# Patient Record
Sex: Female | Born: 1980 | Race: White | Hispanic: No | Smoking: Former smoker
Health system: Southern US, Community
[De-identification: ages and names within clinical notes are randomized; demographics above are authoritative.]

## PROBLEM LIST (undated history)

## (undated) DIAGNOSIS — O24419 Gestational diabetes mellitus in pregnancy, unspecified control: Secondary | ICD-10-CM

## (undated) DIAGNOSIS — N200 Calculus of kidney: Secondary | ICD-10-CM

## (undated) HISTORY — PX: OTHER SURGICAL HISTORY: SHX169

## (undated) HISTORY — PX: TUBAL LIGATION: SHX77

---

## 2014-11-19 ENCOUNTER — Emergency Department (HOSPITAL_COMMUNITY)
Admission: EM | Admit: 2014-11-19 | Discharge: 2014-11-19 | Disposition: A | Discharge status: Home / Self Care | Payer: Self-pay | Attending: Emergency Medicine | Admitting: Emergency Medicine

## 2014-11-19 ENCOUNTER — Encounter (HOSPITAL_COMMUNITY): Payer: Self-pay | Admitting: Emergency Medicine

## 2014-11-19 DIAGNOSIS — Z3202 Encounter for pregnancy test, result negative: Secondary | ICD-10-CM | POA: Insufficient documentation

## 2014-11-19 DIAGNOSIS — Z8632 Personal history of gestational diabetes: Secondary | ICD-10-CM | POA: Insufficient documentation

## 2014-11-19 DIAGNOSIS — R42 Dizziness and giddiness: Secondary | ICD-10-CM | POA: Insufficient documentation

## 2014-11-19 DIAGNOSIS — A084 Viral intestinal infection, unspecified: Secondary | ICD-10-CM | POA: Insufficient documentation

## 2014-11-19 DIAGNOSIS — R51 Headache: Secondary | ICD-10-CM | POA: Insufficient documentation

## 2014-11-19 HISTORY — DX: Gestational diabetes mellitus in pregnancy, unspecified control: O24.419

## 2014-11-19 LAB — COMPREHENSIVE METABOLIC PANEL
ALBUMIN: 3.7 g/dL (ref 3.5–5.0)
ALK PHOS: 79 U/L (ref 38–126)
ALT: 17 U/L (ref 14–54)
ANION GAP: 8 (ref 5–15)
AST: 21 U/L (ref 15–41)
BUN: 10 mg/dL (ref 6–20)
CALCIUM: 8.7 mg/dL — AB (ref 8.9–10.3)
CO2: 22 mmol/L (ref 22–32)
CREATININE: 0.71 mg/dL (ref 0.44–1.00)
Chloride: 107 mmol/L (ref 101–111)
GFR calc Af Amer: >60 mL/min (ref >60)
GFR calc non Af Amer: >60 mL/min (ref >60)
GLUCOSE: 120 mg/dL — AB (ref 65–99)
POTASSIUM: 3.8 mmol/L (ref 3.5–5.1)
SODIUM: 137 mmol/L (ref 135–145)
TOTAL PROTEIN: 6.3 g/dL — AB (ref 6.5–8.1)
Total Bilirubin: 0.5 mg/dL (ref 0.3–1.2)

## 2014-11-19 LAB — CBC
HCT: 40.1 % (ref 36.0–46.0)
Hemoglobin: 13.6 g/dL (ref 12.0–15.0)
MCH: 30.6 pg (ref 26.0–34.0)
MCHC: 33.9 g/dL (ref 30.0–36.0)
MCV: 90.3 fL (ref 78.0–100.0)
PLATELETS: 287 10*3/uL (ref 150–400)
RBC: 4.44 MIL/uL (ref 3.87–5.11)
RDW: 12.6 % (ref 11.5–15.5)
WBC: 16.8 10*3/uL — ABNORMAL HIGH (ref 4.0–10.5)

## 2014-11-19 LAB — I-STAT BETA HCG BLOOD, ED (MC, WL, AP ONLY)

## 2014-11-19 LAB — URINE MICROSCOPIC-ADD ON

## 2014-11-19 LAB — URINALYSIS, ROUTINE W REFLEX MICROSCOPIC
BILIRUBIN URINE: NEGATIVE
GLUCOSE, UA: NEGATIVE mg/dL
HGB URINE DIPSTICK: NEGATIVE
Ketones, ur: NEGATIVE mg/dL
Leukocytes, UA: NEGATIVE
Nitrite: NEGATIVE
PROTEIN: 100 mg/dL — AB
Specific Gravity, Urine: 1.023 (ref 1.005–1.030)
UROBILINOGEN UA: 0.2 mg/dL (ref 0.0–1.0)
pH: 8.5 — ABNORMAL HIGH (ref 5.0–8.0)

## 2014-11-19 LAB — LIPASE, BLOOD: Lipase: 23 U/L (ref 11–51)

## 2014-11-19 MED ORDER — ONDANSETRON 4 MG PO TBDP: 4.0000 mg | ORAL_TABLET | Freq: Three times a day (TID) | ORAL | Status: DC | PRN

## 2014-11-19 MED ORDER — SODIUM CHLORIDE 0.9 % IV BOLUS (SEPSIS)
1000.0000 mL | Freq: Once | INTRAVENOUS | Status: AC
  Administered 2014-11-19: 1000 mL via INTRAVENOUS

## 2014-11-19 MED ORDER — ONDANSETRON HCL 4 MG/2ML IJ SOLN
4.0000 mg | Freq: Once | INTRAMUSCULAR | Status: AC
  Administered 2014-11-19: 4 mg via INTRAVENOUS
  Filled 2014-11-19: qty 2

## 2014-11-19 MED ORDER — PROMETHAZINE HCL 25 MG/ML IJ SOLN
25.0000 mg | Freq: Once | INTRAMUSCULAR | Status: AC
  Administered 2014-11-19: 25 mg via INTRAVENOUS
  Filled 2014-11-19: qty 1

## 2014-11-19 MED ORDER — IBUPROFEN 800 MG PO TABS
800.0000 mg | ORAL_TABLET | Freq: Once | ORAL | Status: AC
  Administered 2014-11-19: 800 mg via ORAL
  Filled 2014-11-19: qty 1

## 2014-11-19 NOTE — ED Notes (Signed)
Pt c/o headache x 1 week. Pt began vomiting and having diarrhea last night.

## 2014-11-19 NOTE — Discharge Instructions (Signed)
Take your medications as prescribed as needed for nausea and vomiting. You may also take Ibuprofen as prescribed over the counter for your headache.  Please follow up with a primary care provider from the Resource Guide provided below in 4-5 days. Please return to the Emergency Department if symptoms worsen or new onset of fever, vomiting blood, blood in stool, abdominal pain, vaginal bleeding, neck stiffness, visual changes, sensitivity to light, numbness, tingling, weakness, seizures, syncope.    Emergency Department Resource Guide 1) Find a Doctor and Pay Out of Pocket Although you won't have to find out who is covered by your insurance plan, it is a good idea to ask around and get recommendations. You will then need to call the office and see if the doctor you have chosen will accept you as a new patient and what types of options they offer for patients who are self-pay. Some doctors offer discounts or will set up payment plans for their patients who do not have insurance, but you will need to ask so you aren't surprised when you get to your appointment.  2) Contact Your Local Health Department Not all health departments have doctors that can see patients for sick visits, but many do, so it is worth a call to see if yours does. If you don't know where your local health department is, you can check in your phone book. The CDC also has a tool to help you locate your state's health department, and many state websites also have listings of all of their local health departments.  3) Find a Walk-in Clinic If your illness is not likely to be very severe or complicated, you may want to try a walk in clinic. These are popping up all over the country in pharmacies, drugstores, and shopping centers. They're usually staffed by nurse practitioners or physician assistants that have been trained to treat common illnesses and complaints. They're usually fairly quick and inexpensive. However, if you have serious  medical issues or chronic medical problems, these are probably not your best option.  No Primary Care Doctor: - Call Health Connect at  [phone] - they can help you locate a primary care doctor that  accepts your insurance, provides certain services, etc. - Physician Referral Service- [phone]  Chronic Pain Problems: Organization         Address  Phone   Notes  [facility]  [phone] Patients need to be referred by their primary care doctor.   Medication Assistance: Organization         Address  Phone   Notes  [facility] Medication [facility] [address] [place] [place], [place] [address] [phone] --Must be a resident of [facility] -- Must have NO insurance coverage whatsoever (no Medicaid/ Medicare, etc.) -- The pt. MUST have a primary care doctor that directs their care regularly and follows them in the community   [facility]ist  [phone]   [employer]  [phone]    Agencies that provide inexpensive medical care: Organization         Address  Phone   Notes  [facility]  [phone]   [facility]    [phone]   [facility] [address] [place], [place] [address] [phone]   [facility] [address] [place], [place] [phone]   Planned Parenthood    [phone]   [facility]    [phone]   [facility]  [address], [place] Phone:  [phone], Fax:  [phone] Hours of Operation:  9 am - 6 pm, M-F.  Also accepts Medicaid/Medicare and self-pay.  [facility]  [address], [place] Phone: [phone], Fax: [phone]. Hours of Operation:  8:30 am - 5:30 pm, M-F.  Also accepts Medicaid and self-pay.  [facility] [place] [address], [place] Phone: [phone]   [facility] [address] Natasha Bence  [place], [place] [phone], Ext. [phone] Mondays & Thursdays: 7-9 AM.  First 15 patients are seen on a first come, first serve basis.    Medicaid-accepting [facility] Providers:  Organization         Address  Phone   Notes  [facility] [address], [place] [phone] Also accepts self-pay patients.  [facility] [address] Laurell Josephs [place], [place]  [phone]   [facility] [address], [place] [phone]   [facility] [address] [place] [phone]   Renaye Rakers [address], [place]   [phone] Only accepts [place] patients after they have their name applied to their card.   Self-Pay (no insurance) in [facility]:  Organization         Address  Phone   Notes  Sickle Cell Patients, [facility] [address] [place], [place] [phone]   [facility] Urgent Care [address] [place], [place] [phone]   [facility]  [address], [place] [phone]   [facility]/Dr. Osei-Bonsu  [address], [place] or [address], [place] [phone] Phone number for both [place] and [place] locations is the same.  Urgent Medical and [facility] [address] [place] [phone]   [facility] [address], [place] or [address] [phone]   [facility] [address] [place] [phone], phone; [phone], fax Sees patients 1st and 3rd Saturday of every month.  Must not qualify for public or private insurance (i.e. Medicaid, Medicare, [place] Health Choice, Veterans' Benefits)  Household income should be no more than 200% of the poverty level The clinic cannot treat you if you are pregnant or think you are pregnant  Sexually  transmitted diseases are not treated at the clinic.    Dental Care: Organization         Address  Phone  Notes  [facility] Department of [facility] [address] [place], [place] [phone] Accepts children up to age 72 who are enrolled in [place] or [place] Health Choice; pregnant women with a Medicaid card; and children who have applied for Medicaid or [place] Health Choice, but were declined, whose parents can pay a reduced fee at time of service.  [facility]  [address], [place] [phone] Accepts children up to age 47 who are enrolled in [place] or [place] Health Choice; pregnant women with a Medicaid card; and children who have applied for Medicaid or [place] Health Choice, but were declined, whose parents can pay a reduced fee at time of service.  [facility]  [address] [place], [place] [phone] Patients are seen by appointment only. Walk-ins are not accepted. [facility] will see patients 20 years of age and older. Monday - Tuesday (8am-5pm) Most Wednesdays (8:30-5pm) $30 per visit, cash only  [facility] Access PRO[facility]  [address], [facility] [phone] Patients are seen by appointment only. Walk-ins are not accepted. [facility] will see patients 28 years of age and older. One Wednesday Evening (Monthly: Volunteer Based).  $30 per visit, cash only  [employer]  [phone] for adults; Children under age 35, call Graduate Pediatric Dentistry at [phone]. Children aged 33-14, please call [phone] to request a pediatric application.  Dental services are provided in all areas of dental care including fillings, crowns and bridges, complete and partial dentures, implants, gum treatment, root canals, and extractions. Preventive care is also provided. Treatment is provided to both adults and children. Patients are  selected via a lottery and there is often a waiting list.   [facility] [address] [place]  [phone] [email or web address]   [facility] [address] [place], [place] [phone], Ext. [phone] Second and Fourth Thursday of each month, opens at 6:30 AM; Clinic ends at 9 AM.  Patients are seen on a first-come first-served basis, and a limited number are seen during each clinic.   [facility]  [address] Ether Griffins [place], [place] [phone]   Eligibility Requirements You must have lived in [place], [place], or [place] counties for at least the last three months.   You cannot be eligible for state or federal sponsored [place], including [employer], [place], or [employer].   You generally cannot be eligible for healthcare insurance through your employer.    How to apply: Eligibility screenings are held every Tuesday and Wednesday afternoon from 1:00 pm until 4:00 pm. You do not need an appointment for the interview!  [facility] [address], [place], [place] [phone]   [facility] Health Department  [phone]   [facility] Health Department  [phone]   [facility] Health Department  [phone]    Behavioral Health Resources in the Community: Intensive Outpatient Programs Organization         Address  Phone  Notes  [facility] [address] [place], [place] [phone]   [facility] [address], [place], [place] [phone]   [facility] [address] [place], [place]  [phone]   [facility] Mental Health [address]  [place], [place] [phone] or [phone]   Substance Abuse Resources Organization         Address  Phone  Notes  [facility]  [phone]   [facility]  [phone]   [facility] [place]  [phone]   Floydene Flock  [phone]     Residential & Outpatient Substance Abuse Program  [phone]   Psychological Services Organization         Address  Phone  Notes  [facility] Behavioral Health  [phone]   [facility] Services  [phone]   [facility] [address], [place] [phone] or [phone]    Mobile Crisis Teams Organization         Address  Phone  Notes  [facility], Mobile Crisis Care Unit  [phone]   [facility]  [address]  [place], [place] [phone]   Doristine LocksSharon DeEsch [address] and [place] [phone]    Self-Help/Support Groups Organization         Address  Phone             Notes  [facility] [place] - variety of support groups  336- [phone] Call for more information  Narcotics Anonymous (NA), [facility]aring [facility] [address], [place]  2 meetings at this location   Statisticianesidential Treatment Programs Organization         Address  Phone  Notes  [facility] [address],    [place]  [phone]   [facility]  [address], [place] [phone], [place], [place] [phone]   [facility] [address], [place] [phone] Admissions: 8am-3pm M-F  [facility] [address]    [place], [place] [phone]   [facility] [address], [place], [place] [phone]   [facility] [address]  [place], [place] [phone]   [facility] - Intensive Outpatient [address], [place], [place] [phone]   [facility] ([facility] [address]  [place], [place] [phone] or [phone]   [facility] ([facility]) [address] [place], [place] [phone] Accepts Medicaid  [facility] [address]  [place] [phone] Substance Abuse/Addiction Treatment   [facility] Behavioral Health  Resources Organization         Address  Phone  Notes  [facility]  [phone]) [phone]   Angie FavaJulie Brannon, PhD [address] [place], [place]   [phone] or [phone]   [facility]   [address] [place], [place] [phone](336) [phone]   [facility] [address], [place], [place] [phone](336) [phone] Insurance/Medicaid/sponsorship through [facility]  [facility] [address]                                    [place], [place] [phone](336) [phone] Therapy/tele-psych/case  [facility] [address]   [place], [place] [phone]) [phone]    Dr. Lolly MustacheArfeen  [phone]) [phone]   [facility]  [employer] [facility] 1) [address], [place] 2) [address], [place] 3)  [address], [place] [phone]) [phone](336) [phone]  [phone](336) [phone]   [facility] Child Abuse Hotline [phone]) [phone] or [phone](336) [phone] (After Hours)

## 2014-11-19 NOTE — ED Notes (Signed)
Drank water without difficulty.

## 2014-11-19 NOTE — ED Provider Notes (Signed)
CSN: [id number]     Arrival date & time 11/19/14  1009 History   First MD Initiated Contact with Patient 11/19/14 1022     Chief Complaint  Patient presents with  . Diarrhea  . Emesis     (Consider location/radiation/quality/duration/timing/severity/associated sxs/prior Treatment) HPI   Patient is a 34 year old female presents to the ED with complaint of headache, onset one week. Patient reports having worsening headache to her posterior scalp that she describes as a constant dull ache, no aggravating or alleviating factors. Denies history of headache/migraine. Pt denies fever, neck stiffness, visual changes, photophobia, abdominal pain, urinary symptoms, numbness, tingling, weakness, seizures, syncope. She notes she has not been taking medication at home for pain. Patient also reports having nausea, vomiting and diarrhea that started last night. Denies blood in emesis or stool. She reports having >10 episodes of vomiting and 4 episodes of diarrhea. Denies any sick contacts.  Past Medical History  Diagnosis Date  . Gestational diabetes    Past Surgical History  Procedure Laterality Date  . Lithrotripsy    . Tubal ligation     No family history on file. Social History  Substance Use Topics  . Smoking status: Never Smoker   . Smokeless tobacco: None  . Alcohol Use: Yes   OB History    No data available     Review of Systems  Gastrointestinal: Positive for nausea, vomiting and diarrhea.  Neurological: Positive for dizziness and headaches.  All other systems reviewed and are negative.     Allergies  Review of patient's allergies indicates no known allergies.  Home Medications   Prior to Admission medications   Medication Sig Start Date End Date Taking? Authorizing Provider  ondansetron (ZOFRAN ODT) 4 MG disintegrating tablet Take 1 tablet (4 mg total) by mouth every 8 (eight) hours as needed for nausea or vomiting. 11/19/14   Satira SarkNicole Elizabeth Vamsi Apfel, PA-C   BP 106/64  mmHg  Pulse 91  Temp(Src) 97.8 F (36.6 C) (Oral)  Resp 22  Ht 5\' 2"  (1.575 m)  Wt 160 lb (72.576 kg)  BMI 29.26 kg/m2  SpO2 100%  LMP 11/07/2014 Physical Exam  Constitutional: She is oriented to person, place, and time. She appears well-developed and well-nourished. No distress.  HENT:  Head: Normocephalic and atraumatic.  Right Ear: Tympanic membrane normal.  Left Ear: Tympanic membrane normal.  Nose: Nose normal. Right sinus exhibits no maxillary sinus tenderness and no frontal sinus tenderness. Left sinus exhibits no maxillary sinus tenderness and no frontal sinus tenderness.  Mouth/Throat: Uvula is midline, oropharynx is clear and moist and mucous membranes are normal. No oropharyngeal exudate, posterior oropharyngeal edema or posterior oropharyngeal erythema.  Eyes: Conjunctivae and EOM are normal. Pupils are equal, round, and reactive to light. Right eye exhibits no discharge. Left eye exhibits no discharge. No scleral icterus.  Neck: Normal range of motion. Neck supple.  Cardiovascular: Normal rate, regular rhythm, normal heart sounds and intact distal pulses.   Pulmonary/Chest: Effort normal and breath sounds normal. No respiratory distress. She has no wheezes. She has no rales. She exhibits no tenderness.  Abdominal: Soft. Bowel sounds are normal. She exhibits no distension and no mass. There is no tenderness. There is no rebound and no guarding.  Musculoskeletal: Normal range of motion. She exhibits no edema.  Lymphadenopathy:    She has no cervical adenopathy.  Neurological: She is alert and oriented to person, place, and time. She has normal strength. No sensory deficit. Coordination and gait normal.  Skin:  Skin is warm and dry.  Nursing note and vitals reviewed.   ED Course  Procedures (including critical care time) Labs Review Labs Reviewed  COMPREHENSIVE METABOLIC PANEL - Abnormal; Notable for the following:    Glucose, Bld 120 (*)    Calcium 8.7 (*)    Total  Protein 6.3 (*)    All other components within normal limits  CBC - Abnormal; Notable for the following:    WBC 16.8 (*)    All other components within normal limits  URINALYSIS, ROUTINE W REFLEX MICROSCOPIC (NOT AT [facility]) - Abnormal; Notable for the following:    APPearance CLOUDY (*)    pH 8.5 (*)    Protein, ur 100 (*)    All other components within normal limits  URINE MICROSCOPIC-ADD ON - Abnormal; Notable for the following:    Squamous Epithelial / LPF FEW (*)    Bacteria, UA FEW (*)    All other components within normal limits  LIPASE, BLOOD  I-STAT BETA HCG BLOOD, ED ([facility], [facility], [facility] ONLY)    Imaging Review No results found. I have personally reviewed and evaluated these images and lab results as part of my medical decision-making.   MDM   Final diagnoses:  Viral gastroenteritis    Pt presents with N/V/D and headache. She reports having mild HA earlier in the week that worsened with onset of vomiting. Denies fever or abdominal pain. VSS. Exam unremarkable, no neuro deficits. Pt given IVF, antiemetics and pain meds. WBC 16.8. UA unremarkable. Pt reports nausea and headache have improved. Pt able to tolerate PO. I suspect sxs are likley due to viral gastroenteritis. I do not suspect intracranial lesion, meningitis, encephalopathy or encephalitis and do not think that cranial imaging is needed at this time.  Plan to d/c pt home with symptomatic tx. Pt advised to follow up with PCP.  Evaluation does not show pathology requring ongoing emergent intervention or admission. Pt is hemodynamically stable and mentating appropriately. Discussed findings/results and plan with patient/guardian, who agrees with plan. All questions answered. Return precautions discussed and outpatient follow up given.      Satira Sark [place], [place] 11/19/14 1409  Laurence Spates, MD 11/19/14 [phone]

## 2019-05-04 DIAGNOSIS — R1013 Epigastric pain: Secondary | ICD-10-CM | POA: Diagnosis not present

## 2019-05-04 DIAGNOSIS — M549 Dorsalgia, unspecified: Secondary | ICD-10-CM | POA: Diagnosis not present

## 2019-05-04 DIAGNOSIS — Z87891 Personal history of nicotine dependence: Secondary | ICD-10-CM | POA: Diagnosis not present

## 2019-05-04 DIAGNOSIS — N3000 Acute cystitis without hematuria: Secondary | ICD-10-CM | POA: Diagnosis not present

## 2019-05-04 DIAGNOSIS — R109 Unspecified abdominal pain: Secondary | ICD-10-CM | POA: Diagnosis not present

## 2019-08-13 DIAGNOSIS — Z6828 Body mass index (BMI) 28.0-28.9, adult: Secondary | ICD-10-CM | POA: Diagnosis not present

## 2019-08-13 DIAGNOSIS — M79641 Pain in right hand: Secondary | ICD-10-CM | POA: Diagnosis not present

## 2019-08-13 DIAGNOSIS — Z87442 Personal history of urinary calculi: Secondary | ICD-10-CM | POA: Diagnosis not present

## 2019-08-13 DIAGNOSIS — Z87891 Personal history of nicotine dependence: Secondary | ICD-10-CM | POA: Diagnosis not present

## 2019-08-13 DIAGNOSIS — G5601 Carpal tunnel syndrome, right upper limb: Secondary | ICD-10-CM | POA: Diagnosis not present

## 2019-08-13 DIAGNOSIS — M7989 Other specified soft tissue disorders: Secondary | ICD-10-CM | POA: Diagnosis not present

## 2019-08-13 DIAGNOSIS — M79644 Pain in right finger(s): Secondary | ICD-10-CM | POA: Diagnosis not present

## 2019-09-21 DIAGNOSIS — J029 Acute pharyngitis, unspecified: Secondary | ICD-10-CM | POA: Diagnosis not present

## 2019-09-21 DIAGNOSIS — U071 COVID-19: Secondary | ICD-10-CM | POA: Diagnosis not present

## 2019-09-21 DIAGNOSIS — Z6828 Body mass index (BMI) 28.0-28.9, adult: Secondary | ICD-10-CM | POA: Diagnosis not present

## 2019-09-21 DIAGNOSIS — R0602 Shortness of breath: Secondary | ICD-10-CM | POA: Diagnosis not present

## 2019-09-21 DIAGNOSIS — R509 Fever, unspecified: Secondary | ICD-10-CM | POA: Diagnosis not present

## 2019-09-21 DIAGNOSIS — Z87891 Personal history of nicotine dependence: Secondary | ICD-10-CM | POA: Diagnosis not present

## 2019-09-21 DIAGNOSIS — H9203 Otalgia, bilateral: Secondary | ICD-10-CM | POA: Diagnosis not present

## 2020-07-23 DIAGNOSIS — Z20822 Contact with and (suspected) exposure to covid-19: Secondary | ICD-10-CM | POA: Diagnosis not present

## 2020-07-23 DIAGNOSIS — N926 Irregular menstruation, unspecified: Secondary | ICD-10-CM | POA: Diagnosis not present

## 2020-07-23 DIAGNOSIS — R109 Unspecified abdominal pain: Secondary | ICD-10-CM | POA: Diagnosis not present

## 2020-07-23 DIAGNOSIS — R5383 Other fatigue: Secondary | ICD-10-CM | POA: Diagnosis not present

## 2020-07-23 DIAGNOSIS — M791 Myalgia, unspecified site: Secondary | ICD-10-CM | POA: Diagnosis not present

## 2020-07-23 DIAGNOSIS — R1013 Epigastric pain: Secondary | ICD-10-CM | POA: Diagnosis not present

## 2020-07-23 DIAGNOSIS — Z87891 Personal history of nicotine dependence: Secondary | ICD-10-CM | POA: Diagnosis not present

## 2020-07-23 DIAGNOSIS — R112 Nausea with vomiting, unspecified: Secondary | ICD-10-CM | POA: Diagnosis not present

## 2020-07-23 DIAGNOSIS — K760 Fatty (change of) liver, not elsewhere classified: Secondary | ICD-10-CM | POA: Diagnosis not present

## 2020-07-23 DIAGNOSIS — D72829 Elevated white blood cell count, unspecified: Secondary | ICD-10-CM | POA: Diagnosis not present

## 2020-07-24 ENCOUNTER — Emergency Department (HOSPITAL_BASED_OUTPATIENT_CLINIC_OR_DEPARTMENT_OTHER)
Admission: EM | Admit: 2020-07-24 | Discharge: 2020-07-25 | Disposition: A | Discharge status: Home / Self Care | Payer: BC Managed Care – PPO | Attending: Emergency Medicine | Admitting: Emergency Medicine

## 2020-07-24 ENCOUNTER — Other Ambulatory Visit: Payer: Self-pay

## 2020-07-24 ENCOUNTER — Emergency Department (HOSPITAL_BASED_OUTPATIENT_CLINIC_OR_DEPARTMENT_OTHER): Payer: BC Managed Care – PPO

## 2020-07-24 ENCOUNTER — Encounter (HOSPITAL_BASED_OUTPATIENT_CLINIC_OR_DEPARTMENT_OTHER): Payer: Self-pay

## 2020-07-24 DIAGNOSIS — R5381 Other malaise: Secondary | ICD-10-CM | POA: Diagnosis not present

## 2020-07-24 DIAGNOSIS — D72829 Elevated white blood cell count, unspecified: Secondary | ICD-10-CM | POA: Diagnosis not present

## 2020-07-24 DIAGNOSIS — G8929 Other chronic pain: Secondary | ICD-10-CM | POA: Diagnosis not present

## 2020-07-24 DIAGNOSIS — F17210 Nicotine dependence, cigarettes, uncomplicated: Secondary | ICD-10-CM | POA: Insufficient documentation

## 2020-07-24 DIAGNOSIS — R110 Nausea: Secondary | ICD-10-CM | POA: Diagnosis not present

## 2020-07-24 DIAGNOSIS — M79641 Pain in right hand: Secondary | ICD-10-CM

## 2020-07-24 DIAGNOSIS — R5383 Other fatigue: Secondary | ICD-10-CM | POA: Insufficient documentation

## 2020-07-24 DIAGNOSIS — M545 Low back pain, unspecified: Secondary | ICD-10-CM

## 2020-07-24 DIAGNOSIS — R112 Nausea with vomiting, unspecified: Secondary | ICD-10-CM | POA: Diagnosis not present

## 2020-07-24 HISTORY — DX: Calculus of kidney: N20.0

## 2020-07-24 NOTE — ED Triage Notes (Signed)
Pt c/o right hand pain x today-denies injury-chart reads dx carpal tunnel 08/2019-states she has had no pain until today-pt also c/o n/v x 2 days-[facility] ED yesterday for same-states she has nausea but no vomiting-NAD-steady gait

## 2020-07-25 LAB — COMPREHENSIVE METABOLIC PANEL
ALT: 13 U/L (ref 0–44)
AST: 15 U/L (ref 15–41)
Albumin: 3.6 g/dL (ref 3.5–5.0)
Alkaline Phosphatase: 65 U/L (ref 38–126)
Anion gap: 7 (ref 5–15)
BUN: 18 mg/dL (ref 6–20)
CO2: 27 mmol/L (ref 22–32)
Calcium: 8.7 mg/dL — ABNORMAL LOW (ref 8.9–10.3)
Chloride: 103 mmol/L (ref 98–111)
Creatinine, Ser: 1.01 mg/dL — ABNORMAL HIGH (ref 0.44–1.00)
GFR, Estimated: >60 mL/min (ref >60)
Glucose, Bld: 92 mg/dL (ref 70–99)
Potassium: 3.7 mmol/L (ref 3.5–5.1)
Sodium: 137 mmol/L (ref 135–145)
Total Bilirubin: 0.3 mg/dL (ref 0.3–1.2)
Total Protein: 6.3 g/dL — ABNORMAL LOW (ref 6.5–8.1)

## 2020-07-25 LAB — CBC WITH DIFFERENTIAL/PLATELET
Abs Immature Granulocytes: 0.08 10*3/uL — ABNORMAL HIGH (ref 0.00–0.07)
Basophils Absolute: 0.1 10*3/uL (ref 0.0–0.1)
Basophils Relative: 1 %
Eosinophils Absolute: 0.4 10*3/uL (ref 0.0–0.5)
Eosinophils Relative: 3 %
HCT: 37.6 % (ref 36.0–46.0)
Hemoglobin: 12.7 g/dL (ref 12.0–15.0)
Immature Granulocytes: 1 %
Lymphocytes Relative: 22 %
Lymphs Abs: 3.2 10*3/uL (ref 0.7–4.0)
MCH: 30.8 pg (ref 26.0–34.0)
MCHC: 33.8 g/dL (ref 30.0–36.0)
MCV: 91 fL (ref 80.0–100.0)
Monocytes Absolute: 0.7 10*3/uL (ref 0.1–1.0)
Monocytes Relative: 5 %
Neutro Abs: 10.3 10*3/uL — ABNORMAL HIGH (ref 1.7–7.7)
Neutrophils Relative %: 68 %
Platelets: 322 10*3/uL (ref 150–400)
RBC: 4.13 MIL/uL (ref 3.87–5.11)
RDW: 12.7 % (ref 11.5–15.5)
WBC: 14.8 10*3/uL — ABNORMAL HIGH (ref 4.0–10.5)
nRBC: 0 % (ref 0.0–0.2)

## 2020-07-25 LAB — URINALYSIS, ROUTINE W REFLEX MICROSCOPIC
Bilirubin Urine: NEGATIVE
Glucose, UA: NEGATIVE mg/dL
Hgb urine dipstick: NEGATIVE
Ketones, ur: NEGATIVE mg/dL
Leukocytes,Ua: NEGATIVE
Nitrite: NEGATIVE
Protein, ur: NEGATIVE mg/dL
Specific Gravity, Urine: 1.025 (ref 1.005–1.030)
pH: 6 (ref 5.0–8.0)

## 2020-07-25 LAB — CK: Total CK: 41 U/L (ref 38–234)

## 2020-07-25 LAB — SEDIMENTATION RATE: Sed Rate: 13 mm/hr (ref 0–22)

## 2020-07-25 LAB — C-REACTIVE PROTEIN: CRP: 0.6 mg/dL (ref <1.0)

## 2020-07-25 MED ORDER — PROCHLORPERAZINE MALEATE 10 MG PO TABS: 10.0000 mg | ORAL_TABLET | Freq: Two times a day (BID) | ORAL | 0 refills | Status: DC | PRN

## 2020-07-25 MED ORDER — KETOROLAC TROMETHAMINE 15 MG/ML IJ SOLN
15.0000 mg | Freq: Once | INTRAMUSCULAR | Status: AC
  Administered 2020-07-25: 15 mg via INTRAVENOUS
  Filled 2020-07-25: qty 1

## 2020-07-25 MED ORDER — PROCHLORPERAZINE EDISYLATE 10 MG/2ML IJ SOLN
10.0000 mg | Freq: Once | INTRAMUSCULAR | Status: AC
  Administered 2020-07-25: 10 mg via INTRAVENOUS
  Filled 2020-07-25: qty 2

## 2020-07-25 MED ORDER — SODIUM CHLORIDE 0.9 % IV BOLUS (SEPSIS)
1000.0000 mL | Freq: Once | INTRAVENOUS | Status: AC
  Administered 2020-07-25: 1000 mL via INTRAVENOUS

## 2020-07-25 MED ORDER — SODIUM CHLORIDE 0.9 % IV SOLN
1000.0000 mL | INTRAVENOUS | Status: DC
  Administered 2020-07-25: 1000 mL via INTRAVENOUS

## 2020-07-25 NOTE — ED Provider Notes (Signed)
[facility] EMERGENCY DEPARTMENT Provider Note  CSN: [id number] Arrival date & time: 07/24/20 2205  Chief Complaint(s) Hand Pain  HPI Hannah Stevenson is a 40 y.o. female presenting with several months of fatigue, diffuse myalgias, feeling poorly,  and several days of nausea. She was seen at [facility] for this 2 days ago and had relatively reassuring work up other than leukocytosis.  Her work-up included a rapid COVID/influenza, thyroid panel, metabolic panel, monoscreen, HIV, and urinalysis.  He also obtain a CT scan of the abdomen and pelvis as well as a right upper quadrant ultrasound.  Patient was sent home with Zofran for her nausea.  She has not had any additional episodes of emesis.  Today she presents for right arm pain from down to the hand.  She denies any falls or trauma.  Pain is worse with motion of the wrist.  No swelling.  No alleviating factors.  Patient reports that she With lower back pain for several months. 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 She denies any bladder/bowel incontinence.  No lower extremity weakness or loss of sensation.  She reports that she has been taking 884m TID of Motrin for the past 1-2 weeks for her back pain.   Hand Pain   Past Medical History Past Medical History:  Diagnosis Date   Gestational diabetes    Kidney stone    There are no problems to display for this patient.  Home Medication(s) Prior to Admission medications   Medication Sig Start Date End Date Taking? Authorizing Provider  ondansetron (ZOFRAN ODT) 4 MG disintegrating tablet Take 1 tablet (4 mg total) by mouth every 8 (eight) hours as needed for nausea or vomiting. 11/19/14   NNona Dell PA-C  prochlorperazine (COMPAZINE) 10 MG tablet Take 1 tablet (10 mg total) by mouth 2 (two) times daily as needed for nausea or vomiting. 07/25/20   Dannya Pitkin, PGrayce Sessions MD                                                                                                                                     Past Surgical History Past Surgical History:  Procedure Laterality Date   lithrotripsy     TUBAL LIGATION     Family History No family history on file.  Social History Social History   Tobacco Use   Smoking status: Some Days    Types: Cigarettes   Smokeless tobacco: Never  Vaping Use   Vaping Use: Never used  Substance Use Topics   Alcohol use: Not Currently   Drug use: No   Allergies Patient has no known allergies.  Review of Systems Review of Systems All other systems are reviewed and are negative for acute change except as noted in the HPI  Physical Exam Vital Signs  I have reviewed the triage vital signs  BP 109/73 (BP Location: Left Arm)   Pulse 76   Temp 98.4 F (36.9 C) (Oral)   Resp 18   Wt 73.5 kg   SpO2 95%   BMI 29.63 kg/m   Physical Exam Vitals reviewed.  Constitutional:      General: She is not in acute distress.    Appearance: She is well-developed. She is not diaphoretic.  HENT:     Head: Normocephalic and atraumatic.     Nose: Nose normal.  Eyes:     General: No scleral icterus.       Right eye: No discharge.        Left eye: No discharge.     Conjunctiva/sclera: Conjunctivae normal.     Pupils: Pupils are equal, round, and reactive to light.  Cardiovascular:     Rate and Rhythm: Normal rate and regular rhythm.     Heart sounds: No murmur heard.   No friction rub. No gallop.  Pulmonary:     Effort: Pulmonary effort is normal. No respiratory distress.     Breath sounds: Normal breath sounds. No stridor. No rales.  Abdominal:     General: There is no distension.     Palpations: Abdomen is soft.     Tenderness: There is no abdominal tenderness.  Musculoskeletal:     Right forearm: Tenderness (to brachioradialis muscle which exacerbates her hand pain) present.     Right hand: Decreased range of motion. Decreased strength.  Normal sensation. Normal pulse.     Left hand: Normal strength. Normal sensation. Normal pulse.     Cervical back: Normal range of motion and neck supple.     Thoracic back: Spasms and tenderness present.     Lumbar back: Spasms and tenderness present.       Back:  Skin:    General: Skin is warm and dry.     Findings: No erythema or rash.  Neurological:     Mental Status: She is alert and oriented to person, place, and time.     Comments: Spine Exam: Strength: 5/5 throughout LE bilaterally  Sensation: Intact to light touch in proximal and distal LE bilaterally      ED Results and Treatments Labs (all labs ordered are listed, but only abnormal results are displayed) Labs Reviewed  CBC WITH DIFFERENTIAL/PLATELET - Abnormal; Notable for the following components:      Result Value   WBC 14.8 (*)    Neutro Abs 10.3 (*)    Abs Immature Granulocytes 0.08 (*)    All other components within normal limits  COMPREHENSIVE METABOLIC PANEL - Abnormal; Notable for the following components:   Creatinine, Ser 1.01 (*)    Calcium 8.7 (*)    Total Protein 6.3 (*)    All other components within normal limits  URINALYSIS, ROUTINE W REFLEX MICROSCOPIC  CK  SEDIMENTATION RATE  C-REACTIVE PROTEIN  EKG  EKG Interpretation  Date/Time:    Ventricular Rate:    PR Interval:    QRS Duration:   QT Interval:    QTC Calculation:   R Axis:     Text Interpretation:         Radiology DG Hand Complete Right  Result Date: 07/24/2020 CLINICAL DATA:  Hand pain EXAM: RIGHT HAND - COMPLETE 3+ VIEW COMPARISON:  08/13/2019 FINDINGS: There is no evidence of fracture or dislocation. There is no evidence of arthropathy or other focal bone abnormality. Soft tissues are unremarkable. IMPRESSION: Negative. Electronically Signed   By: Donavan Foil M.D.   On: 07/24/2020 22:41    Pertinent  labs & imaging results that were available during my care of the patient were reviewed by me and considered in my medical decision making (see chart for details).  Medications Ordered in ED Medications  sodium chloride 0.9 % bolus 1,000 mL (0 mLs Intravenous Stopped 07/25/20 0200)    Followed by  0.9 %  sodium chloride infusion (0 mLs Intravenous Stopped 07/25/20 0250)  prochlorperazine (COMPAZINE) injection 10 mg (10 mg Intravenous Given 07/25/20 0048)  ketorolac (TORADOL) 15 MG/ML injection 15 mg (15 mg Intravenous Given 07/25/20 0203)                                                                                                                                    Procedures Procedures  (including critical care time)  Medical Decision Making / ED Course I have reviewed the nursing notes for this encounter and the patient's prior records (if available in EHR or on provided paperwork).   Hannah Stevenson was evaluated in Emergency Department on 07/25/2020 for the symptoms described in the history of present illness. She was evaluated in the context of the global COVID-19 pandemic, which necessitated consideration that the patient might be at risk for infection with the SARS-CoV-2 virus that causes COVID-19. Institutional protocols and algorithms that pertain to the evaluation of patients at risk for COVID-19 are in a state of rapid change based on information released by regulatory bodies including the CDC and federal and state organizations. These policies and algorithms were followed during the patient's care in the ED.  Patient presents with several months of back pain and right arm pain. No acute traumatic onset. No red flag symptoms of fever, weight loss, saddle anesthesia, weakness, fecal/urinary incontinence or urinary retention.    Screening labs obtain and notable for improving leukocytosis from [facility] at [facility] to 14 here.  Metabolic panel without significant electrolyte derangement renal  sufficiency.  UA without evidence of infection.  CK and ESR negative.  Suspect muscular etiology. No indication for imaging emergently.   Patient's N/V may likely be related to her high dose Motrin use given her negative work up 2 days ago.  Patient was provided with Compazine for nausea.  Able to tolerate oral intake.  Abdomen benign.  No  need to repeat additional labs or imaging.      Final Clinical Impression(s) / ED Diagnoses Final diagnoses:  Hand pain, right  Chronic bilateral low back pain without sciatica  Nausea    The patient appears reasonably screened and/or stabilized for discharge and I doubt any other medical condition or other [facility] requiring further screening, evaluation, or treatment in the ED at this time prior to discharge. Safe for discharge with strict return precautions.  Disposition: Discharge  Condition: Good  I have discussed the results, Dx and Tx plan with the patient/family who expressed understanding and agree(s) with the plan. Discharge instructions discussed at length. The patient/family was given strict return precautions who verbalized understanding of the instructions. No further questions at time of discharge.    ED Discharge Orders          Ordered    prochlorperazine (COMPAZINE) 10 MG tablet  2 times daily PRN        07/25/20 0257            Follow Up: Primary care provider  Schedule an appointment as soon as possible for a visit  if you do not have a primary care physician, contact [facility] at [phone] for referral     This chart was dictated using voice recognition software.  Despite best efforts to proofread,  errors can occur which can change the documentation meaning.    Fatima Blank, MD 07/25/20 [phone]

## 2020-07-25 NOTE — Discharge Instructions (Addendum)
You may use over-the-counter Acetaminophen (Tylenol), topical muscle creams such as SalonPas, Icy Hot, Bengay, etc. Please stretch, apply ice or heat (whichever helps), and have massage therapy for additional assistance.  

## 2020-10-17 ENCOUNTER — Ambulatory Visit (INDEPENDENT_AMBULATORY_CARE_PROVIDER_SITE_OTHER): Payer: BC Managed Care – PPO | Admitting: Nurse Practitioner

## 2020-10-17 ENCOUNTER — Encounter: Payer: Self-pay | Admitting: Nurse Practitioner

## 2020-10-17 ENCOUNTER — Other Ambulatory Visit: Payer: Self-pay

## 2020-10-17 VITALS — BP 96/62 | HR 70 | Temp 97.2°F | Ht 62.21 in | Wt 171.0 lb

## 2020-10-17 DIAGNOSIS — R5383 Other fatigue: Secondary | ICD-10-CM | POA: Diagnosis not present

## 2020-10-17 DIAGNOSIS — Z8261 Family history of arthritis: Secondary | ICD-10-CM | POA: Diagnosis not present

## 2020-10-17 DIAGNOSIS — Z1231 Encounter for screening mammogram for malignant neoplasm of breast: Secondary | ICD-10-CM

## 2020-10-17 DIAGNOSIS — M064 Inflammatory polyarthropathy: Secondary | ICD-10-CM | POA: Diagnosis not present

## 2020-10-17 DIAGNOSIS — Z7689 Persons encountering health services in other specified circumstances: Secondary | ICD-10-CM | POA: Diagnosis not present

## 2020-10-17 DIAGNOSIS — R7301 Impaired fasting glucose: Secondary | ICD-10-CM

## 2020-10-17 DIAGNOSIS — R635 Abnormal weight gain: Secondary | ICD-10-CM | POA: Insufficient documentation

## 2020-10-17 DIAGNOSIS — Z130 Encounter for screening for diseases of the blood and blood-forming organs and certain disorders involving the immune mechanism: Secondary | ICD-10-CM | POA: Insufficient documentation

## 2020-10-17 DIAGNOSIS — E559 Vitamin D deficiency, unspecified: Secondary | ICD-10-CM | POA: Diagnosis not present

## 2020-10-17 DIAGNOSIS — Z789 Other specified health status: Secondary | ICD-10-CM

## 2020-10-17 DIAGNOSIS — Z1329 Encounter for screening for other suspected endocrine disorder: Secondary | ICD-10-CM | POA: Insufficient documentation

## 2020-10-17 DIAGNOSIS — H608X1 Other otitis externa, right ear: Secondary | ICD-10-CM

## 2020-10-17 MED ORDER — MELOXICAM 7.5 MG PO TABS: 7.5000 mg | ORAL_TABLET | Freq: Two times a day (BID) | ORAL | 1 refills | Status: DC | PRN

## 2020-10-17 MED ORDER — HYDROCORTISONE-ACETIC ACID 1-2 % OT SOLN: 4.0000 [drp] | Freq: Two times a day (BID) | OTIC | 0 refills | Status: DC

## 2020-10-17 NOTE — Progress Notes (Signed)
New Patient Office Visit  Subjective:  Patient ID: Hannah Stevenson, female    DOB: Jan 19, 1980  Age: 40 y.o. MRN: [id number]  CC:  Chief Complaint  Patient presents with   New Patient (Initial Visit)    HPI Hannah Stevenson presents to establish primary care provider. States that it has been a long time since she has had a regular provider. Has not had GYN. Is far behind on pap smear screening. Has not had mammogram.  She is reporting a great deal of general aches and pains. She states that she does have a great deal of pain in both feet, specifically the heels. She states that at the beginning of the work day, she feels like she is waling on bruises. By the end of her shift, she feels like she is walking on pins or toothpicks sticking into her feet. She states that she is hobbling by the end of her shifts. Feet hurt equally. She does not take anything for the pain. She also has significant fatigue. 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 Other than that, nothing has changed with her job responsibilities. She does have family history of rheumatoid arthrits.  She did have phh 2/9 score of 12 today. She states that situational issues especially how she is feeling physically, are likely contributing to this increased depression level. She states that she does have family history of depression as well.   Depression screen PHQ 2/9 10/17/2020  Decreased Interest 1  Down, Depressed, Hopeless 1  PHQ - 2 Score 2  Altered sleeping 2  Tired, decreased energy 2  Change in appetite 2  Feeling bad or failure about yourself  1  Trouble concentrating 2  Moving slowly or fidgety/restless 1  Suicidal thoughts 0  PHQ-9 Score 12   The patient states that she does have a feeling of fullness and congestion to the right ear.  Makes it difficult for her to hear.  No pain.  States that she has to rely on lipreading at times to help her hear.  Denies fever, headache, chills  or nasal congestion. Patient states she recently quit smoking.  This was an the past 4 to 5 months.  States she was smoking about a pack of cigarettes per day. She denies other concerns or complaints today.  She denies chest pain, chest pressure, or shortness of breath. He denies headaches or visual disturbances. He denies abdominal pain, nausea, vomiting, or changes in bowel or bladder habits.    Past Medical History:  Diagnosis Date   Gestational diabetes    Kidney stone     Past Surgical History:  Procedure Laterality Date   lithrotripsy     TUBAL LIGATION      Family History  Problem Relation Age of Onset   Melanoma Maternal Grandmother     Social History   Socioeconomic History   Marital status: Divorced    Spouse name: Not on file   Number of children: Not on file   Years of education: Not on file   Highest education level: Not on file  Occupational History   Not on file  Tobacco Use   Smoking status: Some Days    Types: Cigarettes   Smokeless tobacco: Never  Vaping Use   Vaping Use: Never used  Substance and Sexual Activity   Alcohol use: Not Currently   Drug use: No   Sexual activity: Yes  Other Topics Concern   Not on file  Social History Narrative   Not on file   Social Determinants of Health   Financial Resource Strain: Not on file  Food Insecurity: Not on file  Transportation Needs: Not on file  Physical Activity: Not on file  Stress: Not on file  Social Connections: Not on file  Intimate Partner Violence: Not on file    ROS Review of Systems  Constitutional:  Positive for fatigue. Negative for activity change, appetite change, chills and fever.  HENT:  Positive for hearing loss. Negative for congestion, postnasal drip, rhinorrhea, sinus pressure, sinus pain, sneezing and sore throat.        Feeling of right ear fullness.  Difficult to hear from right ear.  Ear is itchy.  Eyes: Negative.   Respiratory:  Negative for cough, chest tightness,  shortness of breath and wheezing.   Cardiovascular:  Negative for chest pain and palpitations.  Gastrointestinal:  Positive for nausea. Negative for abdominal pain, constipation, diarrhea and vomiting.       Patient reports increased heartburn especially when eating.  Takes single dose famotidine every day.  This seems to help.  Endocrine: Negative for cold intolerance, heat intolerance, polydipsia and polyuria.  Genitourinary:  Negative for dyspareunia, dysuria, flank pain, frequency and urgency.  Musculoskeletal:  Positive for arthralgias, back pain, joint swelling and myalgias.       Patient reporting generalized body aches and pain.  She does have specific pain in both of her heels.  She states the beginning of each day she feels like she is walking on stones.  By the end of the day she feels like she is walking on pins and toothpicks.  She has no abnormalities or joint abnormalities to either foot or heel.  Skin:  Negative for rash.  Allergic/Immunologic: Negative for environmental allergies.  Neurological:  Negative for dizziness, weakness and headaches.  Hematological:  Negative for adenopathy.  Psychiatric/Behavioral:  Positive for dysphoric mood. The patient is nervous/anxious.        Patient feels like decreased ability to participate in normal physical activity this is leading to depression and dysphoric mood.   Objective:   Today's Vitals   10/17/20 1542  BP: 96/62  Pulse: 70  Temp: (!) 97.2 F (36.2 C)  SpO2: 98%  Weight: 171 lb 0.2 oz (77.6 kg)  Height: 5' 2.21" (1.58 m)   Body mass index is 31.07 kg/m.   Physical Exam Vitals and nursing note reviewed.  Constitutional:      Appearance: Normal appearance. She is well-developed. She is obese.  HENT:     Head: Normocephalic and atraumatic.     Right Ear: Swelling present. Tympanic membrane is erythematous.     Left Ear: Hearing, tympanic membrane and ear canal normal.     Ears:     Comments: Mild eczematous changes  in outer right ear canal. Eyes:     Pupils: Pupils are equal, round, and reactive to light.  Cardiovascular:     Rate and Rhythm: Normal rate and regular rhythm.     Pulses: Normal pulses.     Heart sounds: Normal heart sounds.  Pulmonary:     Effort: Pulmonary effort is normal.     Breath sounds: Normal breath sounds.  Abdominal:     Palpations: Abdomen is soft.  Musculoskeletal:        General: Normal range of motion.     Cervical back: Normal range of motion and neck supple.  Lymphadenopathy:     Cervical: No cervical adenopathy.  Skin:    General: Skin is warm and dry.     Capillary Refill: Capillary refill takes less than 2 seconds.  Neurological:     General: No focal deficit present.     Mental Status: She is alert and oriented to person, place, and time.  Psychiatric:        Mood and Affect: Mood normal.        Behavior: Behavior normal.        Thought Content: Thought content normal.        Judgment: Judgment normal.    Assessment & Plan:  1. Encounter to establish care Appointment today to establish new primary care provider.  2. Inflammatory polyarthritis (HCC) Will check CBC, thyroid panel, and connective tissue panel for further evaluation.  Had meloxicam 7.5 mg tablets which may be taken up to twice daily as needed for pain and inflammation. - ANA w/Reflex if Positive - Rheumatoid factor - Sedimentation rate - CBC With Differential - TSH + free T4 - meloxicam (MOBIC) 7.5 MG tablet; Take 1 tablet (7.5 mg total) by mouth 2 (two) times daily as needed for pain.  Dispense: 60 tablet; Refill: 1  3. Family history of rheumatoid arthritis Check RA factor due to polyarthritis and family history of rheumatoid arthritis. - Rheumatoid factor  4. Other fatigue Check iron panel, thyroid panel, connective tissue panel for further evaluation.  Discussed results with patient in approximately 2 weeks and next visit. - ANA w/Reflex if Positive - Rheumatoid factor -  Sedimentation rate - CBC With Differential - Comprehensive metabolic panel - TSH + free T4 - Iron, TIBC and Ferritin Panel - Vitamin B12  5. Abnormal weight gain Thyroid panel to be checked today. - TSH + free T4  6. Impaired fasting glucose Check hemoglobin A1c along with routine, fasting labs. - Hemoglobin A1c  7. Vitamin D deficiency Check vitamin D.  Treat deficiency as indicated. - Vitamin D 1,25 dihydroxy  8. 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 Trial acetic acid/hydrocortisone eardrops.  Placed 4 drops in the right ear twice daily.  We will reassess in 2 weeks at next visit.  Consider referral to ENT if no improvement. - acetic acid-hydrocortisone (VOSOL-HC) OTIC solution; Place 4 drops into the right ear 2 (two) times daily.  Dispense: 10 mL; Refill: 0  9. Ex-smoker for less than 1 year Patient reports quitting smoking a few months ago.  Doing well.  10. Encounter for screening mammogram for malignant neoplasm of breast Screening mammogram ordered today. - MM DIGITAL SCREENING BILATERAL; Future   Problem List Items Addressed This Visit       Endocrine   Impaired fasting glucose   Relevant Medications   meloxicam (MOBIC) 7.5 MG tablet   Other Relevant Orders   Hemoglobin A1c     Nervous and Auditory   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   Relevant Medications   acetic acid-hydrocortisone (VOSOL-HC) OTIC solution     Musculoskeletal and Integument   Inflammatory polyarthritis (HCC)   Relevant Medications   meloxicam (MOBIC) 7.5 MG tablet   Other Relevant Orders   ANA w/Reflex if Positive   Rheumatoid factor   Sedimentation rate   CBC With Differential   TSH + free T4     Other   Family history of rheumatoid arthritis   Relevant Orders   Rheumatoid factor   Other fatigue   Relevant Orders   ANA w/Reflex if Positive   Rheumatoid factor   Sedimentation rate  CBC With Differential   Comprehensive metabolic panel   TSH + free  T4   Iron, TIBC and Ferritin Panel   Vitamin B12   Abnormal weight gain   Relevant Orders   TSH + free T4   Vitamin D deficiency   Relevant Orders   Vitamin D 1,25 dihydroxy   Ex-smoker for less than 1 year   Encounter for screening mammogram for malignant neoplasm of breast   Relevant Orders   MM DIGITAL SCREENING BILATERAL   Other Visit Diagnoses     Encounter to establish care    -  Primary       Outpatient Encounter Medications as of 10/17/2020  Medication Sig   acetic acid-hydrocortisone (VOSOL-HC) OTIC solution Place 4 drops into the right ear 2 (two) times daily.   meloxicam (MOBIC) 7.5 MG tablet Take 1 tablet (7.5 mg total) by mouth 2 (two) times daily as needed for pain.   ondansetron (ZOFRAN ODT) 4 MG disintegrating tablet Take 1 tablet (4 mg total) by mouth every 8 (eight) hours as needed for nausea or vomiting.   prochlorperazine (COMPAZINE) 10 MG tablet Take 1 tablet (10 mg total) by mouth 2 (two) times daily as needed for nausea or vomiting.   No facility-administered encounter medications on file as of 10/17/2020.   This note was dictated using Conservation officer, historic buildings. Rapid proofreading was performed to expedite the delivery of the information. Despite proofreading, phonetic errors will occur which are common with this voice recognition software. Please take this into consideration. If there are any concerns, please contact our office.    Follow-up: Return in about 2 weeks (around 10/31/2020) for health maintenance exam, with pap.   Carlean Jews, NP

## 2020-10-18 NOTE — Progress Notes (Signed)
Positive ANA. Waiting on reflex results along with Vitamin d

## 2020-10-18 NOTE — Progress Notes (Signed)
Waiting on all lab results.

## 2020-10-22 NOTE — Progress Notes (Signed)
Positive ANA with elevated centromere AB screen.  This is correlated with scleroderma.  Also elevated white blood cell count.  With patient at next visit on 11/02/2020.

## 2020-11-01 LAB — ANA W/REFLEX IF POSITIVE
Anti JO-1: <0.2 AI (ref 0.0–0.9)
Anti Nuclear Antibody (ANA): POSITIVE — AB
Centromere Ab Screen: 1.9 AI — ABNORMAL HIGH (ref 0.0–0.9)
Chromatin Ab SerPl-aCnc: <0.2 AI (ref 0.0–0.9)
ENA RNP Ab: 0.4 AI (ref 0.0–0.9)
ENA SM Ab Ser-aCnc: <0.2 AI (ref 0.0–0.9)
ENA SSA (RO) Ab: <0.2 AI (ref 0.0–0.9)
ENA SSB (LA) Ab: <0.2 AI (ref 0.0–0.9)
Scleroderma (Scl-70) (ENA) Antibody, IgG: <0.2 AI (ref 0.0–0.9)
dsDNA Ab: <1 IU/mL (ref 0–9)

## 2020-11-01 LAB — CBC WITH DIFFERENTIAL
Basophils Absolute: 0.1 10*3/uL (ref 0.0–0.2)
Basos: 1 %
EOS (ABSOLUTE): 0.6 10*3/uL — ABNORMAL HIGH (ref 0.0–0.4)
Eos: 5 %
Hematocrit: 38.3 % (ref 34.0–46.6)
Hemoglobin: 13 g/dL (ref 11.1–15.9)
Immature Grans (Abs): 0.1 10*3/uL (ref 0.0–0.1)
Immature Granulocytes: 1 %
Lymphocytes Absolute: 2.5 10*3/uL (ref 0.7–3.1)
Lymphs: 19 %
MCH: 29.9 pg (ref 26.6–33.0)
MCHC: 33.9 g/dL (ref 31.5–35.7)
MCV: 88 fL (ref 79–97)
Monocytes Absolute: 0.6 10*3/uL (ref 0.1–0.9)
Monocytes: 5 %
Neutrophils Absolute: 9 10*3/uL — ABNORMAL HIGH (ref 1.4–7.0)
Neutrophils: 69 %
RBC: 4.35 x10E6/uL (ref 3.77–5.28)
RDW: 12.3 % (ref 11.7–15.4)
WBC: 12.9 10*3/uL — ABNORMAL HIGH (ref 3.4–10.8)

## 2020-11-01 LAB — VITAMIN D 1,25 DIHYDROXY
Vitamin D 1, 25 (OH)2 Total: 38 pg/mL
Vitamin D2 1, 25 (OH)2: <10 pg/mL
Vitamin D3 1, 25 (OH)2: 38 pg/mL

## 2020-11-01 LAB — COMPREHENSIVE METABOLIC PANEL
ALT: 10 IU/L (ref 0–32)
AST: 18 IU/L (ref 0–40)
Albumin/Globulin Ratio: 1.8 (ref 1.2–2.2)
Albumin: 4 g/dL (ref 3.8–4.8)
Alkaline Phosphatase: 80 IU/L (ref 44–121)
BUN/Creatinine Ratio: 18 (ref 9–23)
BUN: 16 mg/dL (ref 6–24)
Bilirubin Total: <0.2 mg/dL (ref 0.0–1.2)
CO2: 17 mmol/L — ABNORMAL LOW (ref 20–29)
Calcium: 9 mg/dL (ref 8.7–10.2)
Chloride: 106 mmol/L (ref 96–106)
Creatinine, Ser: 0.91 mg/dL (ref 0.57–1.00)
Globulin, Total: 2.2 g/dL (ref 1.5–4.5)
Sodium: 143 mmol/L (ref 134–144)
Total Protein: 6.2 g/dL (ref 6.0–8.5)
eGFR: 81 mL/min/{1.73_m2} (ref >59)

## 2020-11-01 LAB — TSH+FREE T4
Free T4: 1.11 ng/dL (ref 0.82–1.77)
TSH: 1.83 u[IU]/mL (ref 0.450–4.500)

## 2020-11-01 LAB — IRON,TIBC AND FERRITIN PANEL
Ferritin: 53 ng/mL (ref 15–150)
Iron Saturation: 19 % (ref 15–55)
Iron: 53 ug/dL (ref 27–159)
Total Iron Binding Capacity: 279 ug/dL (ref 250–450)
UIBC: 226 ug/dL (ref 131–425)

## 2020-11-01 LAB — SEDIMENTATION RATE: Sed Rate: 14 mm/hr (ref 0–32)

## 2020-11-01 LAB — HEMOGLOBIN A1C
Est. average glucose Bld gHb Est-mCnc: 111 mg/dL
Hgb A1c MFr Bld: 5.5 % (ref 4.8–5.6)

## 2020-11-01 LAB — VITAMIN B12: Vitamin B-12: 535 pg/mL (ref 232–1245)

## 2020-11-01 LAB — RHEUMATOID FACTOR: Rheumatoid fact SerPl-aCnc: <10 IU/mL (ref <14.0)

## 2020-11-02 ENCOUNTER — Other Ambulatory Visit: Payer: Self-pay

## 2020-11-02 ENCOUNTER — Other Ambulatory Visit (HOSPITAL_COMMUNITY)
Admission: RE | Admit: 2020-11-02 | Discharge: 2020-11-02 | Disposition: A | Discharge status: Home / Self Care | Payer: BC Managed Care – PPO | Source: Ambulatory Visit | Attending: Nurse Practitioner | Admitting: Nurse Practitioner

## 2020-11-02 ENCOUNTER — Ambulatory Visit (INDEPENDENT_AMBULATORY_CARE_PROVIDER_SITE_OTHER): Payer: BC Managed Care – PPO | Admitting: Nurse Practitioner

## 2020-11-02 ENCOUNTER — Encounter: Payer: Self-pay | Admitting: Nurse Practitioner

## 2020-11-02 VITALS — BP 108/71 | HR 64 | Temp 97.7°F | Ht 62.21 in | Wt 173.6 lb

## 2020-11-02 DIAGNOSIS — D72829 Elevated white blood cell count, unspecified: Secondary | ICD-10-CM

## 2020-11-02 DIAGNOSIS — Z01419 Encounter for gynecological examination (general) (routine) without abnormal findings: Secondary | ICD-10-CM | POA: Insufficient documentation

## 2020-11-02 DIAGNOSIS — J324 Chronic pansinusitis: Secondary | ICD-10-CM | POA: Diagnosis not present

## 2020-11-02 DIAGNOSIS — M341 CR(E)ST syndrome: Secondary | ICD-10-CM | POA: Diagnosis not present

## 2020-11-02 DIAGNOSIS — M064 Inflammatory polyarthropathy: Secondary | ICD-10-CM | POA: Diagnosis not present

## 2020-11-02 MED ORDER — AMOXICILLIN 875 MG PO TABS: 875.0000 mg | ORAL_TABLET | Freq: Two times a day (BID) | ORAL | 0 refills | Status: DC

## 2020-11-02 MED ORDER — DICLOFENAC SODIUM 50 MG PO TBEC: 50.0000 mg | DELAYED_RELEASE_TABLET | Freq: Two times a day (BID) | ORAL | 1 refills | Status: DC

## 2020-11-02 NOTE — Progress Notes (Signed)
Discuss with patient at visit 11/02/2020

## 2020-11-02 NOTE — Progress Notes (Signed)
Established Patient Office Visit  Subjective:  Patient ID: Hannah Stevenson, female    DOB: Dec 24, 1980  Age: 40 y.o. MRN: [id number]  CC:  Chief Complaint  Patient presents with   Annual Exam   Gynecologic Exam    HPI Hannah Stevenson presents for annual wellness visit.  She continues did have a great deal of general aches and pains. She states that pain in both feet is especially bad, specifically the heels. She states that at the beginning of the work day, she feels like she is waling on bruises. By the end of her shift, she feels like she is walking on pins or toothpicks sticking into her feet. She states that she is hobbling by the end of her shifts.  She did have routine labs done prior to this visit.  Including labs with a check of connective tissue disease.  Her ANA was positive.  Centromere AB screen was elevated.  This is potentially indicative of scleroderma or other connective tissue disease.  She also had an elevated white blood cell count.  She does not report symptoms associated with illness. She denies chest pain, chest pressure, or shortness of breath. She denies headaches or visual disturbances. She denies abdominal pain, nausea, vomiting, or changes in bowel or bladder habits.    Past Medical History:  Diagnosis Date   Gestational diabetes    Kidney stone     Past Surgical History:  Procedure Laterality Date   lithrotripsy     TUBAL LIGATION      Family History  Problem Relation Age of Onset   Melanoma Maternal Grandmother     Social History   Socioeconomic History   Marital status: Divorced    Spouse name: Not on file   Number of children: Not on file   Years of education: Not on file   Highest education level: Not on file  Occupational History   Not on file  Tobacco Use   Smoking status: Some Days    Types: Cigarettes   Smokeless tobacco: Never  Vaping Use   Vaping Use: Never used  Substance and Sexual Activity   Alcohol use: Not Currently   Drug use: No    Sexual activity: Yes  Other Topics Concern   Not on file  Social History Narrative   Not on file   Social Determinants of Health   Financial Resource Strain: Not on file  Food Insecurity: Not on file  Transportation Needs: Not on file  Physical Activity: Not on file  Stress: Not on file  Social Connections: Not on file  Intimate Partner Violence: Not on file    Outpatient Medications Prior to Visit  Medication Sig Dispense Refill   acetic acid-hydrocortisone (VOSOL-HC) OTIC solution Place 4 drops into the right ear 2 (two) times daily. 10 mL 0   meloxicam (MOBIC) 7.5 MG tablet Take 1 tablet (7.5 mg total) by mouth 2 (two) times daily as needed for pain. 60 tablet 1   ondansetron (ZOFRAN ODT) 4 MG disintegrating tablet Take 1 tablet (4 mg total) by mouth every 8 (eight) hours as needed for nausea or vomiting. 10 tablet 0   prochlorperazine (COMPAZINE) 10 MG tablet Take 1 tablet (10 mg total) by mouth 2 (two) times daily as needed for nausea or vomiting. 10 tablet 0   No facility-administered medications prior to visit.    No Known Allergies  ROS Review of Systems  Constitutional:  Positive for fatigue. Negative for activity change, appetite change, chills and  fever.  HENT:  Negative for congestion, postnasal drip, rhinorrhea, sinus pressure, sinus pain, sneezing and sore throat.   Eyes: Negative.   Respiratory:  Negative for cough, chest tightness, shortness of breath and wheezing.   Cardiovascular:  Negative for chest pain and palpitations.  Gastrointestinal:  Negative for abdominal pain, constipation, diarrhea, nausea and vomiting.  Endocrine: Negative for cold intolerance, heat intolerance, polydipsia and polyuria.  Genitourinary:  Negative for dyspareunia, dysuria, flank pain, frequency and urgency.  Musculoskeletal:  Positive for arthralgias and myalgias. Negative for back pain.         Patient reporting generalized body aches and pain.  She does have specific pain in  both of her heels.  She states the beginning of each day she feels like she is walking on stones.  By the end of the day she feels like she is walking on pins and toothpicks.  She has no abnormalities or joint abnormalities to either foot or heel.   Skin:  Negative for rash.  Allergic/Immunologic: Negative for environmental allergies.  Neurological:  Negative for dizziness, weakness and headaches.  Hematological:  Negative for adenopathy.  Psychiatric/Behavioral:  Positive for dysphoric mood. The patient is not nervous/anxious.      Objective:    Physical Exam Vitals and nursing note reviewed. Exam conducted with a chaperone present.  Constitutional:      Appearance: Normal appearance. She is well-developed.  HENT:     Head: Normocephalic and atraumatic.     Right Ear: Tympanic membrane, ear canal and external ear normal.     Left Ear: Tympanic membrane, ear canal and external ear normal.     Nose: Nose normal.     Mouth/Throat:     Mouth: Mucous membranes are moist.     Pharynx: Oropharynx is clear.  Eyes:     Extraocular Movements: Extraocular movements intact.     Conjunctiva/sclera: Conjunctivae normal.     Pupils: Pupils are equal, round, and reactive to light.  Cardiovascular:     Rate and Rhythm: Normal rate and regular rhythm.     Pulses: Normal pulses.     Heart sounds: Normal heart sounds.  Pulmonary:     Effort: Pulmonary effort is normal.     Breath sounds: Normal breath sounds.  Chest:  Breasts:    Right: Normal. No swelling, bleeding, inverted nipple, mass, nipple discharge, skin change or tenderness.     Left: Normal. No swelling, bleeding, inverted nipple, mass, nipple discharge, skin change or tenderness.  Abdominal:     General: Bowel sounds are normal.     Palpations: Abdomen is soft.     Tenderness: There is no abdominal tenderness.     Hernia: There is no hernia in the left inguinal area or right inguinal area.  Genitourinary:    General: Normal vulva.      Exam position: Supine.     Labia:        Right: No rash, tenderness or lesion.        Left: No rash, tenderness or lesion.      Vagina: Normal. No signs of injury. No vaginal discharge, erythema, tenderness, bleeding or lesions.     Cervix: No cervical motion tenderness, discharge, friability, lesion, erythema or cervical bleeding.     Uterus: Normal.      Adnexa: Right adnexa normal and left adnexa normal.     Comments: No tenderness, masses, or organomeglay present during bimanual exam .   Musculoskeletal:  General: Normal range of motion.     Cervical back: Normal range of motion and neck supple.  Lymphadenopathy:     Cervical: No cervical adenopathy.     Upper Body:     Right upper body: No axillary adenopathy.     Left upper body: No axillary adenopathy.     Lower Body: No right inguinal adenopathy. No left inguinal adenopathy.  Skin:    General: Skin is warm and dry.     Capillary Refill: Capillary refill takes less than 2 seconds.  Neurological:     General: No focal deficit present.     Mental Status: She is alert and oriented to person, place, and time.  Psychiatric:        Mood and Affect: Mood normal.        Behavior: Behavior normal.        Thought Content: Thought content normal.        Judgment: Judgment normal.    Today's Vitals   11/02/20 1026  BP: 108/71  Pulse: 64  Temp: 97.7 F (36.5 C)  SpO2: 100%  Weight: 173 lb 9.6 oz (78.7 kg)  Height: 5' 2.21" (1.58 m)   Body mass index is 31.54 kg/m.   Wt Readings from Last 3 Encounters:  11/02/20 173 lb 9.6 oz (78.7 kg)  10/17/20 171 lb 0.2 oz (77.6 kg)  07/24/20 162 lb (73.5 kg)     Health Maintenance Due  Topic Date Due   Hepatitis C Screening  Never done    There are no preventive care reminders to display for this patient.  Lab Results  Component Value Date   TSH 1.830 10/17/2020   Lab Results  Component Value Date   WBC 12.9 (H) 10/17/2020   HGB 13.0 10/17/2020   HCT 38.3  10/17/2020   MCV 88 10/17/2020   PLT 322 07/25/2020   Lab Results  Component Value Date   NA 143 10/17/2020   K CANCELED 10/17/2020   CO2 17 (L) 10/17/2020   GLUCOSE CANCELED 10/17/2020   BUN 16 10/17/2020   CREATININE 0.91 10/17/2020   BILITOT <0.2 10/17/2020   ALKPHOS 80 10/17/2020   AST 18 10/17/2020   ALT 10 10/17/2020   PROT 6.2 10/17/2020   ALBUMIN 4.0 10/17/2020   CALCIUM 9.0 10/17/2020   ANIONGAP 7 07/25/2020   EGFR 81 10/17/2020   No results found for: CHOL No results found for: HDL No results found for: LDLCALC No results found for: TRIG No results found for: CHOLHDL Lab Results  Component Value Date   HGBA1C 5.5 10/17/2020      Assessment & Plan:  1. Well woman exam Annual well woman exam today.  Pap smear obtained during today's visit. 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  This may be indicative of scleroderma.  Refer to rheumatology for further evaluation. - Ambulatory referral to Rheumatology  3. Inflammatory polyarthritis ([place]) Start diclofenac 50 mg tablets up to twice daily as needed for pain and inflammation.  A referral to rheumatology made today. - diclofenac (VOLTAREN) 50 MG EC tablet; Take 1 tablet (50 mg total) by mouth 2 (two) times daily.  Dispense: 45 tablet; Refill: 1  4. Chronic pansinusitis Elevated white blood cell count with current labs.  Potentially chronic sinusitis.  Start amoxicillin 875 mg twice daily for next 10 days.  Reassess CBC in few weeks for surveillance. - amoxicillin (  AMOXIL) 875 MG tablet; Take 1 tablet (875 mg total) by mouth 2 (two) times daily.  Dispense: 20 tablet; Refill: 0  5. Leukocytosis, unspecified type Unclear etiology, possibly sinusitis.  Amoxicillin twice daily as prescribed.  Recheck CBC in 3 weeks for surveillance. - amoxicillin (AMOXIL) 875 MG tablet; Take 1 tablet (875 mg total) by mouth 2  (two) times daily.  Dispense: 20 tablet; Refill: 0   Problem List Items Addressed This Visit       Cardiovascular and Mediastinum   CREST variant of scleroderma (HCC)   Relevant Orders   Ambulatory referral to Rheumatology     Respiratory   Chronic pansinusitis   Relevant Medications   amoxicillin (AMOXIL) 875 MG tablet     Musculoskeletal and Integument   Inflammatory polyarthritis (HCC)   Relevant Medications   diclofenac (VOLTAREN) 50 MG EC tablet     Other   Leukocytosis   Relevant Medications   amoxicillin (AMOXIL) 875 MG tablet   Other Visit Diagnoses     Well woman exam    -  Primary   Relevant Orders   Cytology - PAP( [place]) (Completed)       Meds ordered this encounter  Medications   diclofenac (VOLTAREN) 50 MG EC tablet    Sig: Take 1 tablet (50 mg total) by mouth 2 (two) times daily.    Dispense:  45 tablet    Refill:  1    Please d/c meloxicam    Order Specific Question:   Supervising Provider    Answer:   Beatrice Lecher D [[id number]]   amoxicillin (AMOXIL) 875 MG tablet    Sig: Take 1 tablet (875 mg total) by mouth 2 (two) times daily.    Dispense:  20 tablet    Refill:  0    Order Specific Question:   Supervising Provider    Answer:   Beatrice Lecher D [[id number]]    Follow-up: Return in about 4 weeks (around 11/30/2020) for check CBC in 3 weeks - elevated WBC .    Ronnell Freshwater, NP  This note was dictated using Systems analyst. Rapid proofreading was performed to expedite the delivery of the information. Despite proofreading, phonetic errors will occur which are common with this voice recognition software. Please take this into consideration. If there are any concerns, please contact our office.

## 2020-11-05 LAB — CYTOLOGY - PAP
Comment: NEGATIVE
Diagnosis: NEGATIVE
High risk HPV: NEGATIVE

## 2020-11-05 NOTE — Progress Notes (Signed)
Reviewed with patient during visit. Referred to rheumatology.

## 2020-11-07 NOTE — Progress Notes (Signed)
Please let the patient know that her pap smear was normal. We will repeat this in three years. Thanks so much.   -HB

## 2020-11-08 ENCOUNTER — Telehealth: Payer: Self-pay | Admitting: Nurse Practitioner

## 2020-11-08 NOTE — Telephone Encounter (Signed)
Patient states she has broken out in a rash on her neck since starting the anti-inflammatory and antibiotic. She isn't sure which is causing the rash. I advised patient you were out of the office and wouldn't return until Monday.   I advised patient to discontinue use of both medications and suggested she take a benadryl for the rash. Patient advised is symptoms worsen (difficulty breathing, difficulty swallowing, tongue swelling, eyes swelling) then she should go to the ED for evaluation and treatment. Patient verbalized understanding.    Do you want to change patients medications?AS, CMA

## 2020-11-11 DIAGNOSIS — J324 Chronic pansinusitis: Secondary | ICD-10-CM | POA: Insufficient documentation

## 2020-11-11 DIAGNOSIS — D72829 Elevated white blood cell count, unspecified: Secondary | ICD-10-CM | POA: Insufficient documentation

## 2020-11-11 DIAGNOSIS — M341 CR(E)ST syndrome: Secondary | ICD-10-CM | POA: Insufficient documentation

## 2020-11-11 NOTE — Telephone Encounter (Signed)
Thank you. That is exactly the advice I would have given. We will still recheck her CBC in 2 weeks. If WBC is still elevated, will try different category of antibiotics. Also, please let her know she can use tylenol as needed for pain. Thanks.

## 2020-11-12 ENCOUNTER — Other Ambulatory Visit: Payer: Self-pay | Admitting: Nurse Practitioner

## 2020-11-12 DIAGNOSIS — R21 Rash and other nonspecific skin eruption: Secondary | ICD-10-CM

## 2020-11-12 MED ORDER — TRIAMCINOLONE ACETONIDE 0.025 % EX CREA: 1.0000 "application " | TOPICAL_CREAM | Freq: Two times a day (BID) | CUTANEOUS | 2 refills | Status: DC

## 2020-11-12 MED ORDER — METHYLPREDNISOLONE 4 MG PO TBPK: ORAL_TABLET | ORAL | 0 refills | Status: DC

## 2020-11-12 NOTE — Telephone Encounter (Signed)
Called pt she is advised of repeating blood work, also trying different antibiotics and continued to take tylenol as needed

## 2020-11-13 ENCOUNTER — Other Ambulatory Visit: Payer: Self-pay

## 2020-11-13 DIAGNOSIS — R21 Rash and other nonspecific skin eruption: Secondary | ICD-10-CM

## 2020-11-13 MED ORDER — METHYLPREDNISOLONE 4 MG PO TBPK: ORAL_TABLET | ORAL | 0 refills | Status: DC

## 2020-11-13 MED ORDER — TRIAMCINOLONE ACETONIDE 0.025 % EX CREA: 1.0000 "application " | TOPICAL_CREAM | Freq: Two times a day (BID) | CUTANEOUS | 2 refills | Status: DC

## 2020-11-21 ENCOUNTER — Other Ambulatory Visit: Payer: Self-pay

## 2020-11-21 DIAGNOSIS — D72829 Elevated white blood cell count, unspecified: Secondary | ICD-10-CM

## 2020-11-22 ENCOUNTER — Other Ambulatory Visit: Payer: Self-pay

## 2020-11-22 ENCOUNTER — Other Ambulatory Visit: Payer: BC Managed Care – PPO

## 2020-11-22 DIAGNOSIS — D72829 Elevated white blood cell count, unspecified: Secondary | ICD-10-CM

## 2020-11-23 LAB — CBC WITH DIFFERENTIAL/PLATELET
Basophils Absolute: 0.1 10*3/uL (ref 0.0–0.2)
Basos: 1 %
EOS (ABSOLUTE): 0.5 10*3/uL — ABNORMAL HIGH (ref 0.0–0.4)
Eos: 5 %
Hematocrit: 41.1 % (ref 34.0–46.6)
Hemoglobin: 14 g/dL (ref 11.1–15.9)
Immature Grans (Abs): 0 10*3/uL (ref 0.0–0.1)
Immature Granulocytes: 0 %
Lymphocytes Absolute: 2.9 10*3/uL (ref 0.7–3.1)
Lymphs: 29 %
MCH: 30.1 pg (ref 26.6–33.0)
MCHC: 34.1 g/dL (ref 31.5–35.7)
MCV: 88 fL (ref 79–97)
Monocytes Absolute: 0.4 10*3/uL (ref 0.1–0.9)
Monocytes: 4 %
Neutrophils Absolute: 6 10*3/uL (ref 1.4–7.0)
Neutrophils: 61 %
Platelets: 313 10*3/uL (ref 150–450)
RBC: 4.65 x10E6/uL (ref 3.77–5.28)
RDW: 12.6 % (ref 11.7–15.4)
WBC: 10 10*3/uL (ref 3.4–10.8)

## 2020-11-27 ENCOUNTER — Other Ambulatory Visit: Payer: Self-pay

## 2020-11-27 ENCOUNTER — Encounter: Payer: Self-pay | Admitting: Nurse Practitioner

## 2020-11-27 ENCOUNTER — Ambulatory Visit (INDEPENDENT_AMBULATORY_CARE_PROVIDER_SITE_OTHER): Payer: BC Managed Care – PPO | Admitting: Nurse Practitioner

## 2020-11-27 VITALS — BP 115/77 | HR 84 | Temp 97.4°F | Ht 62.0 in | Wt 176.0 lb

## 2020-11-27 DIAGNOSIS — J0140 Acute pansinusitis, unspecified: Secondary | ICD-10-CM | POA: Diagnosis not present

## 2020-11-27 DIAGNOSIS — M064 Inflammatory polyarthropathy: Secondary | ICD-10-CM

## 2020-11-27 MED ORDER — AZITHROMYCIN 250 MG PO TABS: ORAL_TABLET | ORAL | 0 refills | Status: DC

## 2020-11-27 NOTE — Progress Notes (Signed)
Normal WBC. Reviewed with patient during visit

## 2020-11-27 NOTE — Progress Notes (Signed)
Established Patient Office Visit  Subjective:  Patient ID: Hannah Stevenson, female    DOB: 1980/06/02  Age: 40 y.o. MRN: [id number]  CC:  Chief Complaint  Patient presents with   Follow-up    HPI Hannah Stevenson presents for follow up visit. She had elevated WBC with check of routine labs. She did take a 10 days course of amoxicillin. Recheck of CBC was normal. She does have upcoming appointment with rheumatology due ot generalized joint pai and evidence of scleroderma on labs. She has ahd trials of NSAIDs along with medrol taper. She has had no relief of symptoms.  Today, she states she has developed cough over the past several days. She has increased fatigue and joint pain. She has also started to develop some congestion. Denies fever, chills, nausea, or vomiting.   Past Medical History:  Diagnosis Date   Gestational diabetes    Kidney stone     Past Surgical History:  Procedure Laterality Date   lithrotripsy     TUBAL LIGATION      Family History  Problem Relation Age of Onset   Melanoma Maternal Grandmother     Social History   Socioeconomic History   Marital status: Divorced    Spouse name: Not on file   Number of children: Not on file   Years of education: Not on file   Highest education level: Not on file  Occupational History   Not on file  Tobacco Use   Smoking status: Some Days    Types: Cigarettes   Smokeless tobacco: Never  Vaping Use   Vaping Use: Never used  Substance and Sexual Activity   Alcohol use: Not Currently   Drug use: No   Sexual activity: Yes  Other Topics Concern   Not on file  Social History Narrative   Not on file   Social Determinants of Health   Financial Resource Strain: Not on file  Food Insecurity: Not on file  Transportation Needs: Not on file  Physical Activity: Not on file  Stress: Not on file  Social Connections: Not on file  Intimate Partner Violence: Not on file    Outpatient Medications Prior to Visit  Medication  Sig Dispense Refill   acetic acid-hydrocortisone (VOSOL-HC) OTIC solution Place 4 drops into the right ear 2 (two) times daily. 10 mL 0   amoxicillin (AMOXIL) 875 MG tablet Take 1 tablet (875 mg total) by mouth 2 (two) times daily. 20 tablet 0   diclofenac (VOLTAREN) 50 MG EC tablet Take 1 tablet (50 mg total) by mouth 2 (two) times daily. 45 tablet 1   meloxicam (MOBIC) 7.5 MG tablet Take 1 tablet (7.5 mg total) by mouth 2 (two) times daily as needed for pain. 60 tablet 1   methylPREDNISolone (MEDROL) 4 MG TBPK tablet Take by mouth as directed for 6 days 21 tablet 0   ondansetron (ZOFRAN ODT) 4 MG disintegrating tablet Take 1 tablet (4 mg total) by mouth every 8 (eight) hours as needed for nausea or vomiting. 10 tablet 0   prochlorperazine (COMPAZINE) 10 MG tablet Take 1 tablet (10 mg total) by mouth 2 (two) times daily as needed for nausea or vomiting. 10 tablet 0   triamcinolone (KENALOG) 0.025 % cream Apply 1 application topically 2 (two) times daily. 80 g 2   No facility-administered medications prior to visit.    No Known Allergies  ROS Review of Systems  Constitutional:  Positive for fatigue. Negative for activity change, appetite change, chills and  fever.  HENT:  Positive for congestion and postnasal drip. Negative for rhinorrhea, sinus pressure, sinus pain, sneezing and sore throat.   Eyes: Negative.   Respiratory:  Positive for cough. Negative for chest tightness, shortness of breath and wheezing.   Cardiovascular:  Negative for chest pain and palpitations.  Gastrointestinal:  Negative for abdominal pain, constipation, diarrhea, nausea and vomiting.  Endocrine: Negative for cold intolerance, heat intolerance, polydipsia and polyuria.  Genitourinary:  Negative for dyspareunia, dysuria, flank pain, frequency and urgency.  Musculoskeletal:  Positive for arthralgias and myalgias. Negative for back pain.         Patient reporting generalized body aches and pain.  Has not done well with  trial of NSAIDs. She does have specific pain in both of her heels.  She states the beginning of each day she feels like she is walking on stones.  By the end of the day she feels like she is walking on pins and toothpicks.  She has no abnormalities or joint abnormalities to either foot or heel.   Skin:  Negative for rash.  Allergic/Immunologic: Negative for environmental allergies.  Neurological:  Negative for dizziness, weakness and headaches.  Hematological:  Negative for adenopathy.  Psychiatric/Behavioral:  Positive for dysphoric mood. The patient is not nervous/anxious.      Objective:    Physical Exam Vitals and nursing note reviewed.  Constitutional:      Appearance: Normal appearance. She is well-developed.  HENT:     Head: Normocephalic and atraumatic.     Right Ear: Tympanic membrane is erythematous and bulging.     Left Ear: Tympanic membrane is erythematous and bulging.     Nose: Congestion present.     Right Sinus: Frontal sinus tenderness present.     Left Sinus: Frontal sinus tenderness present.     Mouth/Throat:     Mouth: Mucous membranes are moist.  Eyes:     Extraocular Movements: Extraocular movements intact.     Conjunctiva/sclera: Conjunctivae normal.     Pupils: Pupils are equal, round, and reactive to light.  Cardiovascular:     Rate and Rhythm: Normal rate and regular rhythm.     Pulses: Normal pulses.     Heart sounds: Normal heart sounds.  Pulmonary:     Effort: Pulmonary effort is normal.     Breath sounds: Normal breath sounds.     Comments: Dry cough present  Abdominal:     Palpations: Abdomen is soft.  Musculoskeletal:        General: Normal range of motion.     Cervical back: Normal range of motion and neck supple.     Comments: Generalized joint pain without evidence of inflammation or swelling at this time.   Lymphadenopathy:     Cervical: No cervical adenopathy.  Skin:    General: Skin is warm and dry.     Capillary Refill: Capillary  refill takes less than 2 seconds.  Neurological:     General: No focal deficit present.     Mental Status: She is alert and oriented to person, place, and time.  Psychiatric:        Mood and Affect: Mood normal.        Behavior: Behavior normal.        Thought Content: Thought content normal.        Judgment: Judgment normal.    Today's Vitals   11/27/20 1058  BP: 115/77  Pulse: 84  Temp: (!) 97.4 F (36.3 C)  SpO2: 98%    Weight: 176 lb (79.8 kg)  Height: 5' 2" (1.575 m)   Body mass index is 32.19 kg/m.   Wt Readings from Last 3 Encounters:  11/27/20 176 lb (79.8 kg)  11/02/20 173 lb 9.6 oz (78.7 kg)  10/17/20 171 lb 0.2 oz (77.6 kg)     Health Maintenance Due  Topic Date Due   COVID-19 Vaccine (1) Never done   Hepatitis C Screening  Never done    There are no preventive care reminders to display for this patient.  Lab Results  Component Value Date   TSH 1.830 10/17/2020   Lab Results  Component Value Date   WBC 10.0 11/22/2020   HGB 14.0 11/22/2020   HCT 41.1 11/22/2020   MCV 88 11/22/2020   PLT 313 11/22/2020   Lab Results  Component Value Date   NA 143 10/17/2020   K CANCELED 10/17/2020   CO2 17 (L) 10/17/2020   GLUCOSE CANCELED 10/17/2020   BUN 16 10/17/2020   CREATININE 0.91 10/17/2020   BILITOT <0.2 10/17/2020   ALKPHOS 80 10/17/2020   AST 18 10/17/2020   ALT 10 10/17/2020   PROT 6.2 10/17/2020   ALBUMIN 4.0 10/17/2020   CALCIUM 9.0 10/17/2020   ANIONGAP 7 07/25/2020   EGFR 81 10/17/2020   No results found for: CHOL No results found for: HDL No results found for: LDLCALC No results found for: TRIG No results found for: CHOLHDL Lab Results  Component Value Date   HGBA1C 5.5 10/17/2020      Assessment & Plan:  1. Inflammatory polyarthritis (HCC) Trial of NSAIDs did not improve symptoms. She does have upcoming consultation with rheumatology.   2. Acute non-recurrent pansinusitis Start z-pack. Take as directed for 5 days. Rest and  increase fluids. Continue using OTC medication to control symptoms.   - azithromycin (ZITHROMAX) 250 MG tablet; z-pack - take as directed for 5 days  Dispense: 6 tablet; Refill: 0    Problem List Items Addressed This Visit       Respiratory   Acute non-recurrent pansinusitis   Relevant Medications   azithromycin (ZITHROMAX) 250 MG tablet     Musculoskeletal and Integument   Inflammatory polyarthritis (HCC) - Primary    Meds ordered this encounter  Medications   azithromycin (ZITHROMAX) 250 MG tablet    Sig: z-pack - take as directed for 5 days    Dispense:  6 tablet    Refill:  0    Order Specific Question:   Supervising Provider    Answer:   METHENEY, CATHERINE D [[id number]]    Follow-up: Return in about 11 months (around 10/27/2021) for health maintenance exam, FBW a week prior to visit.    Heather E Boscia, NP 

## 2020-12-02 DIAGNOSIS — J0140 Acute pansinusitis, unspecified: Secondary | ICD-10-CM | POA: Insufficient documentation

## 2020-12-12 NOTE — Progress Notes (Deleted)
Office Visit Note  Patient: Hannah Stevenson             Date of Birth: 05-13-1980           MRN: [id number]             PCP: Ronnell Freshwater, NP Referring: Ronnell Freshwater, NP Visit Date: 12/13/2020 Occupation: @GUAROCC @  Subjective:  No chief complaint on file.   History of Present Illness: Hannah Stevenson is a 40 y.o. female here for evaluation of positive ANA with multiple joint pains and skin rashes. She was treated with trial of oral NSAIDs and medrol dose pack without significant symptom relief.***   Labs reviewed 10/2020 ANA pos Centromere 1.9 dsDNA, RNP, SM, Scl-70, SSA, SSB, chromatin, Jo-1 neg RF neg ESR 14 CBC 12.9 WBCs CMP CO2 17 Vit D 36 Iron wnl TSH/T4 wnl  Activities of Daily Living:  Patient reports morning stiffness for *** {minute/hour:19697}.   Patient {ACTIONS;DENIES/REPORTS:21021675::"Denies"} nocturnal pain.  Difficulty dressing/grooming: {ACTIONS;DENIES/REPORTS:21021675::"Denies"} Difficulty climbing stairs: {ACTIONS;DENIES/REPORTS:21021675::"Denies"} Difficulty getting out of chair: {ACTIONS;DENIES/REPORTS:21021675::"Denies"} Difficulty using hands for taps, buttons, cutlery, and/or writing: {ACTIONS;DENIES/REPORTS:21021675::"Denies"}  No Rheumatology ROS completed.   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/11/2020   Leukocytosis 11/11/2020   Inflammatory polyarthritis ([place]) 10/17/2020   Family history of rheumatoid arthritis 10/17/2020   Other fatigue 10/17/2020   Abnormal weight gain 10/17/2020   Impaired fasting glucose 10/17/2020   Vitamin D deficiency 10/17/2020   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 10/17/2020   Ex-smoker for less than 1 year 10/17/2020   Encounter for screening mammogram for malignant neoplasm of breast 10/17/2020    Past Medical History:  Diagnosis Date    Gestational diabetes    Kidney stone     Family History  Problem Relation Age of Onset   Melanoma Maternal Grandmother    Past Surgical History:  Procedure Laterality Date   lithrotripsy     TUBAL LIGATION     Social History   Social History Narrative   Not on file    There is no immunization history on file for this patient.   Objective: Vital Signs: LMP 11/13/2020    Physical Exam   Musculoskeletal Exam: ***  CDAI Exam: CDAI Score: -- Patient Global: --; Provider Global: -- Swollen: --; Tender: -- Joint Exam 12/13/2020   No joint exam has been documented for this visit   There is currently no information documented on the homunculus. Go to the Rheumatology activity and complete the homunculus joint exam.  Investigation: No additional findings.  Imaging: No results found.  Recent Labs: Lab Results  Component Value Date   WBC 10.0 11/22/2020   HGB 14.0 11/22/2020   PLT 313 11/22/2020   NA 143 10/17/2020   K CANCELED 10/17/2020   CL 106 10/17/2020   CO2 17 (L) 10/17/2020   GLUCOSE CANCELED 10/17/2020   BUN 16 10/17/2020   CREATININE 0.91 10/17/2020   BILITOT <0.2 10/17/2020   ALKPHOS 80 10/17/2020   AST 18 10/17/2020   ALT 10 10/17/2020   PROT 6.2 10/17/2020   ALBUMIN 4.0 10/17/2020   CALCIUM 9.0 10/17/2020   GFRAA >60 11/19/2014    Speciality Comments: No specialty comments available.  Procedures:  No procedures performed Allergies: Patient has no known allergies.   Assessment / Plan:     Visit Diagnoses: No diagnosis found.  Orders: No orders of the defined types were placed in this encounter.  No orders of the defined types were placed in this encounter.   Face-to-face time spent with patient was *** minutes. Greater than 50% of time was spent in counseling and coordination of care.  Follow-Up Instructions: No follow-ups on file.   Collier Salina, MD  Note - This record has been created using [employer].  Chart  creation errors have been sought, but may not always  have been located. Such creation errors do not reflect on  the standard of medical care.

## 2020-12-13 ENCOUNTER — Ambulatory Visit: Payer: BC Managed Care – PPO | Admitting: Internal Medicine

## 2020-12-20 ENCOUNTER — Ambulatory Visit: Payer: BC Managed Care – PPO

## 2021-01-09 ENCOUNTER — Encounter: Payer: Self-pay | Admitting: Internal Medicine

## 2021-01-09 ENCOUNTER — Ambulatory Visit (INDEPENDENT_AMBULATORY_CARE_PROVIDER_SITE_OTHER): Payer: BC Managed Care – PPO | Admitting: Internal Medicine

## 2021-01-09 ENCOUNTER — Other Ambulatory Visit: Payer: Self-pay

## 2021-01-09 VITALS — BP 95/61 | HR 67 | Resp 16 | Ht 63.0 in | Wt 177.0 lb

## 2021-01-09 DIAGNOSIS — M064 Inflammatory polyarthropathy: Secondary | ICD-10-CM

## 2021-01-09 DIAGNOSIS — M722 Plantar fascial fibromatosis: Secondary | ICD-10-CM | POA: Diagnosis not present

## 2021-01-09 DIAGNOSIS — R768 Other specified abnormal immunological findings in serum: Secondary | ICD-10-CM | POA: Diagnosis not present

## 2021-01-09 NOTE — Progress Notes (Signed)
Office Visit Note  Patient: Hannah Stevenson             Date of Birth: [birth year]           MRN: [id number]             PCP: Ronnell Freshwater, NP Referring: Ronnell Freshwater, NP Visit Date: 01/09/2021 Occupation: [employer]  Subjective:  New Patient (Initial Visit) (Abnormal labs)   History of Present Illness: Hannah Stevenson is a 41 y.o. female here for evaluation of multiple joint pains with positive ANA and positive anti centromere antibodies.  She has multiple generalized symptoms but worst is the joint pains particularly during about the past 1 year.  Feet and heels have been the most affected areas.  She describes feeling like a bruised sensation on the heels and bottoms of her feet particularly first thing in the morning with some mild swelling and stinging type of pain occurring by the end of the day.  She does not notice any specific joint swelling but sometimes has mild feet or ankle swelling after standing for the whole day.  She has had limited benefit with trial of NSAIDs or medrol taper. She takes tylenol as needed which is partially helpful. Her physical activity and exercise are decreased due to having more pain with exertion.  She reports starting to have some weight regain due to the decreased exercise despite previous work at weight loss.  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 Outside of these pain she also has some generalized issues particularly muscle stiffness and tenderness in her upper back.  This causes some pain going to the shoulders no radiation or localized numbness type of changes.  Also reports pain in her hands worse with use again without significant swelling or visible changes.  Xray of right hand in July for joint pain was normal. She had a skin rash that started on the shins spreading to involve all of her extremities before resolving with residual hyperpigmentation at the initial sites.  She originally thought this was  poison oak that had spread. She denies any focal alopecia, oral ulcers, cervical adenopathy, typical Raynaud's, or history of blood clots.  Labs reviewed 10/2020 ANA pos Centromere 1.9 dsDNA, RNP, SM, Scl-70, SSA, SSB, chromatin, Jo-1 neg RF neg ESR 14 CBC WBC 12.9  Activities of Daily Living:  Patient reports morning stiffness for 24 hours.   Patient Reports nocturnal pain.  Difficulty dressing/grooming: Reports Difficulty climbing stairs: Reports Difficulty getting out of chair: Reports Difficulty using hands for taps, buttons, cutlery, and/or writing: Reports  Review of Systems  Constitutional:  Positive for fatigue.  HENT:  Positive for mouth dryness.   Eyes:  Negative for dryness.  Respiratory:  Positive for shortness of breath.   Cardiovascular:  Positive for swelling in legs/feet.  Gastrointestinal:  Negative for constipation.  Endocrine: Positive for cold intolerance, heat intolerance, excessive thirst and increased urination.  Genitourinary:  Positive for difficulty urinating and painful urination.  Musculoskeletal:  Positive for joint pain, gait problem, joint pain, muscle weakness, morning stiffness and muscle tenderness.  Skin:  Positive for rash.  Allergic/Immunologic: Positive for susceptible to infections.  Neurological:  Positive for numbness and weakness.  Hematological:  Positive for bruising/bleeding tendency.  Psychiatric/Behavioral:  Positive for sleep disturbance.    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/11/2020   Leukocytosis 11/11/2020   Inflammatory polyarthritis ([place]) 10/17/2020   Family history of rheumatoid arthritis 10/17/2020   Other fatigue 10/17/2020   Abnormal weight gain 10/17/2020   Impaired fasting  glucose 10/17/2020   Vitamin D deficiency 10/17/2020   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 10/17/2020   Ex-smoker for less than 1 year 10/17/2020   Encounter for screening mammogram for malignant neoplasm of breast 10/17/2020    Past Medical History:  Diagnosis Date   Gestational diabetes    Kidney stone     Family History  Problem Relation Age of Onset   Melanoma Maternal Grandmother    Past Surgical History:  Procedure Laterality Date   lithrotripsy     TUBAL LIGATION     Social History   Social History Narrative   Not on file    There is no immunization history on file for this patient.   Objective: Vital Signs: BP 95/61 (BP Location: Right Arm, Patient Position: Sitting, Cuff Size: Normal)    Pulse 67    Resp 16    Ht '5\' 3"'  (1.6 m)    Wt 177 lb (80.3 kg)    BMI 31.35 kg/m    Physical Exam Constitutional:      Appearance: She is obese.  HENT:     Right Ear: External ear normal.     Left Ear: External ear normal.     Mouth/Throat:     Mouth: Mucous membranes are moist.     Pharynx: Oropharynx is clear.  Eyes:     Conjunctiva/sclera: Conjunctivae normal.  Cardiovascular:     Rate and Rhythm: Normal rate and regular rhythm.  Pulmonary:     Effort: Pulmonary effort is normal.     Breath sounds: Normal breath sounds.  Musculoskeletal:     Right lower leg: No edema.     Left lower leg: No edema.  Skin:    General: Skin is warm and dry.     Comments: Chronic faintly erythematous irregular patches on anterior shin Normal nailfold capillary appearance  Neurological:     General: No focal deficit present.     Mental Status: She is alert.     Deep Tendon Reflexes: Reflexes normal.  Psychiatric:        Mood and Affect: Mood normal.     Musculoskeletal Exam:  Neck full ROM no tenderness Shoulders full ROM, pain with thoracic paraspinal muscles worst between scapulae Elbows full ROM no tenderness or swelling Wrists full ROM, no swelling, mild pain at  forearm and upper arm provoked with wrist movements Fingers full ROM no tenderness or swelling Knees full ROM no tenderness or swelling Ankles full ROM no tenderness or swelling Tenderness to pressure on bottom of calcaneus anterior edge worse on right than left, low foot arch bilaterally MTPs full ROM no tenderness or swelling   Investigation: No additional findings.  Imaging: No results found.  Recent Labs: Lab Results  Component Value Date   WBC 10.0 11/22/2020   HGB 14.0 11/22/2020   PLT 313 11/22/2020   NA 143 10/17/2020   K CANCELED 10/17/2020   CL 106 10/17/2020   CO2 17 (L) 10/17/2020   GLUCOSE CANCELED 10/17/2020   BUN 16 10/17/2020   CREATININE 0.91 10/17/2020   BILITOT <0.2 10/17/2020   ALKPHOS 80 10/17/2020   AST 18 10/17/2020   ALT 10 10/17/2020  PROT 6.2 10/17/2020   ALBUMIN 4.0 10/17/2020   CALCIUM 9.0 10/17/2020   GFRAA >60 11/19/2014    Speciality Comments: No specialty comments available.  Procedures:  No procedures performed Allergies: Patient has no known allergies.   Assessment / Plan:     Visit Diagnoses: Positive ANA (antinuclear antibody) Inflammatory polyarthritis ([place]) - Plan: ANA, Sedimentation rate, C-reactive protein, C3 and C4  Positive ANA negative serologies except for a low positive anticentromere antibodies.  Clinically she does not present with scleroderma clinical picture no abnormal nailfold capillaroscopy skin thickening telangiectasias dysphagia or pulmonary complaints.  We will plan to recheck ANA with pattern and titer.  Checking inflammatory markers and serum complement level as well.  If these are unremarkable could have a significant component of myofascial pain with the reported upper and lower back symptoms.  Bilateral plantar fasciitis  Pain at the plantar surface of heels appears very consistent with plantar fasciitis.  So far not very responsive to oral anti-inflammatory medications.  Provided range of motion  exercises to perform daily as initial treatment.   Orders: Orders Placed This Encounter  Procedures   ANA   Sedimentation rate   C-reactive protein   C3 and C4   No orders of the defined types were placed in this encounter.    Follow-Up Instructions: Return in about 3 weeks (around 01/30/2021) for New pt f/u 3wks.   Collier Salina, MD  Note - This record has been created using [employer].  Chart creation errors have been sought, but may not always  have been located. Such creation errors do not reflect on  the standard of medical care.

## 2021-01-11 ENCOUNTER — Ambulatory Visit: Payer: BC Managed Care – PPO | Admitting: Internal Medicine

## 2021-01-13 LAB — C3 AND C4
C3 Complement: 112 mg/dL (ref 83–193)
C4 Complement: 26 mg/dL (ref 15–57)

## 2021-01-13 LAB — ANTI-NUCLEAR AB-TITER (ANA TITER): ANA Titer 1: 1:80 {titer} — ABNORMAL HIGH

## 2021-01-13 LAB — SEDIMENTATION RATE: Sed Rate: 6 mm/h (ref 0–20)

## 2021-01-13 LAB — ANA: Anti Nuclear Antibody (ANA): POSITIVE — AB

## 2021-01-13 LAB — C-REACTIVE PROTEIN: CRP: 1.8 mg/L (ref <8.0)

## 2021-01-30 ENCOUNTER — Ambulatory Visit: Payer: BC Managed Care – PPO | Admitting: Internal Medicine

## 2021-01-30 NOTE — Progress Notes (Signed)
Office Visit Note  Patient: Hannah Stevenson             Date of Birth: 03/15/1980           MRN: [id number]             PCP: Ronnell Freshwater, NP Referring: Ronnell Freshwater, NP Visit Date: 01/31/2021   Subjective:  still felling tired (Says feet are feeling better. Still having aches all over. Having a scratchy throat again)   History of Present Illness: [name] is a 41 y.o. female here for follow up for joint pains muscle pains and rashes with positive ANA and centromere Abs. Labs checked at initial visit were negative for inflammatory markers or complements and ANA checked at 1:80 titer.  She had improvement in pain on bilateral feet with the plantar fasciitis exercises.  However she continues having similar amounts of pain in other joints and throughout her back.  Skin rash on her right leg has not improved she has noticed new spots near the middle of her abdomen that are also very itchy.  So far skin rashes have not improved at all with the use of the topical triamcinolone from her primary care office.  Previous HPI 01/09/21 [name] is a 41 y.o. female here for evaluation of multiple joint pains with positive ANA and positive anti centromere antibodies.  She has multiple generalized symptoms but worst is the joint pains particularly during about the past 1 year.  Feet and heels have been the most affected areas.  She describes feeling like a bruised sensation on the heels and bottoms of her feet particularly first thing in the morning with some mild swelling and stinging type of pain occurring by the end of the day.  She does not notice any specific joint swelling but sometimes has mild feet or ankle swelling after standing for the whole day.  She has had limited benefit with trial of NSAIDs or medrol taper. She takes tylenol as needed which is partially helpful. Her physical activity and exercise are decreased due to having more pain with exertion.  She reports starting to have some  weight regain due to the decreased exercise despite previous work at weight loss.  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 Outside of these pain she also has some generalized issues particularly muscle stiffness and tenderness in her upper back.  This causes some pain going to the shoulders no radiation or localized numbness type of changes.  Also reports pain in her hands worse with use again without significant swelling or visible changes.  Xray of right hand in July for joint pain was normal. She had a skin rash that started on the shins spreading to involve all of her extremities before resolving with residual hyperpigmentation at the initial sites.  She originally thought this was poison oak that had spread. She denies any focal alopecia, oral ulcers, cervical adenopathy, typical Raynaud's, or history of blood clots.   Labs reviewed 10/2020 ANA pos Centromere 1.9 dsDNA, RNP, SM, Scl-70, SSA, SSB, chromatin, Jo-1 neg RF neg ESR 14 CBC WBC 12.9   Review of Systems  Constitutional:  Positive for fatigue and weight gain. Negative for fever.  Musculoskeletal:  Positive for joint pain, joint pain and muscle tenderness. Negative for joint swelling.  Skin:  Positive for rash.  Neurological:  Negative for numbness and weakness.   PMFS History:  Patient Active Problem List   Diagnosis  Date Noted   Rash and other nonspecific skin eruption 01/31/2021   Myofascial pain 01/31/2021   Bilateral plantar fasciitis 01/09/2021   Positive ANA (antinuclear antibody) 01/09/2021   Acute non-recurrent pansinusitis 12/02/2020   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/11/2020   Leukocytosis 11/11/2020   Inflammatory polyarthritis ([place]) 10/17/2020   Family history of rheumatoid arthritis 10/17/2020   Other fatigue 10/17/2020   Abnormal weight gain 10/17/2020   Impaired fasting glucose 10/17/2020   Vitamin D deficiency 10/17/2020   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 10/17/2020    Ex-smoker for less than 1 year 10/17/2020   Encounter for screening mammogram for malignant neoplasm of breast 10/17/2020    Past Medical History:  Diagnosis Date   Gestational diabetes    Kidney stone     Family History  Problem Relation Age of Onset   Melanoma Maternal Grandmother    Past Surgical History:  Procedure Laterality Date   lithrotripsy     TUBAL LIGATION     Social History   Social History Narrative   Not on file    There is no immunization history on file for this patient.   Objective: Vital Signs: BP 100/66    Pulse 73    Ht '5\' 3"'  (1.6 m)    Wt 180 lb 12.8 oz (82 kg)    BMI 32.03 kg/m    Physical Exam Constitutional:      Appearance: She is obese.  Skin:    General: Skin is warm and dry.     Findings: Rash present.     Comments: Irregular, faintly erythematous, shiny, flat rash on right anterior shin without visible excoriations Small rash right of umbilicus isolated papules no large patch  Neurological:     Mental Status: She is alert.  Psychiatric:        Mood and Affect: Mood normal.     Musculoskeletal Exam:  Shoulders full ROM no tenderness or swelling Elbows full ROM no tenderness or swelling Wrists full ROM no tenderness or swelling Fingers full ROM no tenderness or swelling Paraspinal muscle tenderness worst in thoracic spine area and between scapulae Knees full ROM no tenderness or swelling Ankles full ROM no tenderness or swelling Plantar calcaneal tenderness decreased    Investigation: No additional findings.  Imaging: No results found.  Recent Labs: Lab Results  Component Value Date   WBC 10.0 11/22/2020   HGB 14.0 11/22/2020   PLT 313 11/22/2020   NA 143 10/17/2020   K CANCELED 10/17/2020   CL 106 10/17/2020   CO2 17 (L) 10/17/2020   GLUCOSE CANCELED 10/17/2020   BUN 16 10/17/2020   CREATININE 0.91 10/17/2020   BILITOT <0.2 10/17/2020   ALKPHOS 80 10/17/2020   AST 18 10/17/2020   ALT 10 10/17/2020   PROT 6.2  10/17/2020   ALBUMIN 4.0 10/17/2020   CALCIUM 9.0 10/17/2020   GFRAA >60 11/19/2014    Speciality Comments: No specialty comments available.  Procedures:  No procedures performed Allergies: Patient has no known allergies.   Assessment / Plan:     Visit Diagnoses: Myofascial pain - Plan: cyclobenzaprine (FLEXERIL) 10 MG tablet  Chronic pain still affecting her a lot in the upper back and in her arms without any joint or inflammation changes.  Right now this looks most consistent with some myofascial pain syndrome.  She does not describe all features associated with fibromyalgia syndrome but this would be a possibility.  I recommended she review the online [facility] [place] patient website for  this.  Also try addition of 10 mg cyclobenzaprine at night as a short-term treatment option.  Abnormal weight gain  Positive ANA (antinuclear antibody) Inflammatory polyarthritis ([facility])  Her low positive ANA and centromere antibodies seem to be nonspecific I cannot confirm any inflammatory changes or other clinical criteria on exam or labs at this time.  I discussed this may be a false positive do not recommend starting any DMARD treatment at this time.  We can consider rechecking labs in 6 months time or if symptoms progressively worsen.  Bilateral plantar fasciitis  Symptoms improving recommend she continue the at home Plan[place] fasciitis exercises do not think any additional intervention needed at this time.  Rash and other nonspecific skin eruption - Plan: clobetasol cream (TEMOVATE) 0.05 %, Ambulatory referral to Dermatology  Ongoing skin rash symptoms ensue looks consistent with possible eczema process both the lack of response to topical steroids so far.  Recommend trial of a higher potency treatment with topical clobetasol 0.5% twice daily to try using this consistently for at least 2 weeks to see if there is any better improvement.  Will refer to dermatology for further evaluation and  management of these rashes that are now persistent for 3 years without improvement.  Orders: Orders Placed This Encounter  Procedures   Ambulatory referral to Dermatology   Meds ordered this encounter  Medications   clobetasol cream (TEMOVATE) 0.05 %    Sig: Apply 1 application topically 2 (two) times daily.    Dispense:  30 g    Refill:  1   cyclobenzaprine (FLEXERIL) 10 MG tablet    Sig: Take 1 tablet (10 mg total) by mouth at bedtime.    Dispense:  30 tablet    Refill:  2     Follow-Up Instructions: Return in about 3 months (around 05/01/2021) for ANA/Rash/Pain f/u 48mo.   CCollier Salina MD  Note - This record has been created using [employer]  Chart creation errors have been sought, but may not always  have been located. Such creation errors do not reflect on  the standard of medical care.

## 2021-01-31 ENCOUNTER — Encounter: Payer: Self-pay | Admitting: Internal Medicine

## 2021-01-31 ENCOUNTER — Other Ambulatory Visit: Payer: Self-pay

## 2021-01-31 ENCOUNTER — Ambulatory Visit (INDEPENDENT_AMBULATORY_CARE_PROVIDER_SITE_OTHER): Payer: BC Managed Care – PPO | Admitting: Internal Medicine

## 2021-01-31 VITALS — BP 100/66 | HR 73 | Ht 63.0 in | Wt 180.8 lb

## 2021-01-31 DIAGNOSIS — R768 Other specified abnormal immunological findings in serum: Secondary | ICD-10-CM | POA: Diagnosis not present

## 2021-01-31 DIAGNOSIS — R635 Abnormal weight gain: Secondary | ICD-10-CM

## 2021-01-31 DIAGNOSIS — M7918 Myalgia, other site: Secondary | ICD-10-CM | POA: Insufficient documentation

## 2021-01-31 DIAGNOSIS — M722 Plantar fascial fibromatosis: Secondary | ICD-10-CM | POA: Diagnosis not present

## 2021-01-31 DIAGNOSIS — M064 Inflammatory polyarthropathy: Secondary | ICD-10-CM

## 2021-01-31 DIAGNOSIS — R21 Rash and other nonspecific skin eruption: Secondary | ICD-10-CM | POA: Insufficient documentation

## 2021-01-31 MED ORDER — CYCLOBENZAPRINE HCL 10 MG PO TABS: 10.0000 mg | ORAL_TABLET | Freq: Every day | ORAL | 2 refills | Status: DC

## 2021-01-31 MED ORDER — CLOBETASOL PROPIONATE 0.05 % EX CREA: 1.0000 "application " | TOPICAL_CREAM | Freq: Two times a day (BID) | CUTANEOUS | 1 refills | Status: DC

## 2021-01-31 NOTE — Patient Instructions (Addendum)
I don't see any current evidence of scleroderma and do not think any new treatments or additional screening for that are needed.  I am referring you to dermatology for the persistent skin rashes that are not improving. Try using the clobetasol which is a higher potency steroid twice daily on the affected area to see if there is more benefit.  Your pain seems to be more muscular or maybe from a chronic pain syndrome since the joints look very good. This could be myofascial pain. I recommend trying the flexeril (cyclobenzaprine) at night that can help decrease some of the muscle irritation or spasticity.  I recommend checking out the [facility] [place] patient-centered guide for fibromyalgia and chronic pain management: [email or web address]

## 2021-04-28 NOTE — Progress Notes (Signed)
Office Visit Note  Patient: Hannah Stevenson             Date of Birth: 1980/04/15           MRN: [id number]             PCP: Ronnell Freshwater, NP Referring: Ronnell Freshwater, NP Visit Date: 04/29/2021   Subjective:  Follow-up (Aching)   History of Present Illness: Hannah Stevenson is a 41 y.o. female here for follow up for positive ANA skin rashes and myofascial pain. Tried addition of as needed flexeril and topical clobetasol and was referred to dermatology for evaluation. She has partial benefit but not really alleviating pain I her peripheral joints which is limiting some activity.  Previous HPI 01/31/21 Hannah Stevenson is a 41 y.o. female here for follow up for joint pains muscle pains and rashes with positive ANA and centromere Abs. Labs checked at initial visit were negative for inflammatory markers or complements and ANA checked at 1:80 titer.  She had improvement in pain on bilateral feet with the plantar fasciitis exercises.  However she continues having similar amounts of pain in other joints and throughout her back.  Skin rash on her right leg has not improved she has noticed new spots near the middle of her abdomen that are also very itchy.  So far skin rashes have not improved at all with the use of the topical triamcinolone from her primary care office.   Previous HPI 01/09/21 Hannah Stevenson is a 41 y.o. female here for evaluation of multiple joint pains with positive ANA and positive anti centromere antibodies.  She has multiple generalized symptoms but worst is the joint pains particularly during about the past 1 year.  Feet and heels have been the most affected areas.  She describes feeling like a bruised sensation on the heels and bottoms of her feet particularly first thing in the morning with some mild swelling and stinging type of pain occurring by the end of the day. She does not notice any specific joint swelling but sometimes has mild feet or ankle swelling after standing for the whole  day.  She has had limited benefit with trial of NSAIDs or medrol taper. She takes tylenol as needed which is partially helpful. Her physical activity and exercise are decreased due to having more pain with exertion.  She reports starting to have some weight regain due to the decreased exercise despite previous work at weight loss.  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 Outside of these pain she also has some generalized issues particularly muscle stiffness and tenderness in her upper back.  This causes some pain going to the shoulders no radiation or localized numbness type of changes.  Also reports pain in her hands worse with use again without significant swelling or visible changes.  Xray of right hand in July for joint pain was normal. She had a skin rash that started on the shins spreading to involve all of her extremities before resolving with residual hyperpigmentation at the initial sites.  She originally thought this was poison oak that had spread. She denies any focal alopecia, oral ulcers, cervical adenopathy, typical Raynaud's, or history of blood clots.   Labs reviewed 10/2020 ANA pos Centromere 1.9 dsDNA, RNP, SM, Scl-70, SSA, SSB, chromatin, Jo-1 neg RF neg ESR 14 CBC WBC 12.9   Review of Systems  Constitutional:  Positive for fatigue.  HENT:  Positive for mouth dryness.  Eyes:  Negative for dryness.  Respiratory:  Positive for shortness of breath.   Cardiovascular:  Negative for swelling in legs/feet.  Gastrointestinal:  Negative for constipation.  Endocrine: Positive for cold intolerance and increased urination.  Genitourinary:  Negative for difficulty urinating.  Musculoskeletal:  Positive for joint pain, gait problem, joint pain, muscle weakness, morning stiffness and muscle tenderness.  Skin:  Negative for rash.  Allergic/Immunologic: Positive for susceptible to infections.  Neurological:  Positive for numbness and weakness.   Hematological:  Positive for bruising/bleeding tendency.  Psychiatric/Behavioral:  Positive for sleep disturbance.    PMFS History:  Patient Active Problem List   Diagnosis Date Noted   Acid reflux 05/13/2021   Rash and other nonspecific skin eruption 01/31/2021   Myofascial pain 01/31/2021   Bilateral plantar fasciitis 01/09/2021   Positive ANA (antinuclear antibody) 01/09/2021   Acute non-recurrent pansinusitis 12/02/2020   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/11/2020   Leukocytosis 11/11/2020   Inflammatory polyarthritis ([place]) 10/17/2020   Family history of rheumatoid arthritis 10/17/2020   Other fatigue 10/17/2020   Abnormal weight gain 10/17/2020   Impaired fasting glucose 10/17/2020   Vitamin D deficiency 10/17/2020   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 10/17/2020   Ex-smoker for less than 1 year 10/17/2020   Encounter for screening mammogram for malignant neoplasm of breast 10/17/2020    Past Medical History:  Diagnosis Date   Gestational diabetes    Kidney stone     Family History  Problem Relation Age of Onset   Melanoma Maternal Grandmother    Past Surgical History:  Procedure Laterality Date   lithrotripsy     TUBAL LIGATION     Social History   Social History Narrative   Not on file    There is no immunization history on file for this patient.   Objective: Vital Signs: BP 117/82 (BP Location: Right Arm, Patient Position: Sitting, Cuff Size: Normal)   Pulse 73   Resp 15   Ht 5' 2.5" (1.588 m)   Wt 182 lb (82.6 kg)   BMI 32.76 kg/m    Physical Exam Cardiovascular:     Rate and Rhythm: Normal rate and regular rhythm.  Musculoskeletal:     Right lower leg: No edema.     Left lower leg: No edema.  Skin:    General: Skin is warm and dry.     Findings: Rash present.  Neurological:     Mental Status: She is alert.     Musculoskeletal Exam:  Neck full ROM no tenderness Shoulders full ROM, pain with thoracic paraspinal muscles worst between  scapulae Elbows full ROM no tenderness or swelling, no focal tenderness at medial or lateral epicondyle Pain in forearm muscle and tendon with pressure and limiting right hand ROM and grip strength Fingers full ROM no tenderness or swelling Knees full ROM no tenderness or swelling Ankles full ROM no tenderness or swelling    Investigation: No additional findings.  Imaging: No results found.  Recent Labs: Lab Results  Component Value Date   WBC 10.0 11/22/2020   HGB 14.0 11/22/2020   PLT 313 11/22/2020   NA 143 10/17/2020   K CANCELED 10/17/2020   CL 106 10/17/2020   CO2 17 (L) 10/17/2020   GLUCOSE CANCELED 10/17/2020   BUN 16 10/17/2020   CREATININE 0.91 10/17/2020   BILITOT <0.2 10/17/2020   ALKPHOS 80 10/17/2020   AST 18 10/17/2020   ALT 10 10/17/2020   PROT 6.2 10/17/2020   ALBUMIN 4.0 10/17/2020  CALCIUM 9.0 10/17/2020   GFRAA >60 11/19/2014    Speciality Comments: No specialty comments available.  Procedures:  No procedures performed Allergies: Patient has no known allergies.   Assessment / Plan:     Visit Diagnoses: Inflammatory polyarthritis ([place]) Hand pain  No obvious inflammatory changes again on exam today despite symptoms. A lot of pain or tenderness localizing also to forearm. Questionable for some chronic strain or tendinopathy issue. Does not appear to be neurologic issue. I don't think there is any indication for advancing imaging workup. I don't recommend trial of immunomodulatory medication or prolonged steroids. May benefit with therapy referral evaluate for her pain and decreased mobility.  Myofascial pain  Ongoing back and trunk pain similar as before, some benefit can continue QHS PRN 10 mg flexeril.  Gastroesophageal reflux disease, unspecified whether esophagitis present   Plan: pantoprazole (PROTONIX) 40 MG tablet, limiting NSAID use PRN for symptoms.   Orders: No orders of the defined types were placed in this encounter.  Meds  ordered this encounter  Medications   pantoprazole (PROTONIX) 40 MG tablet    Sig: Take 1 tablet (40 mg total) by mouth daily.    Dispense:  30 tablet    Refill:  2     Follow-Up Instructions: No follow-ups on file.   Collier Salina, MD  Note - This record has been created using [employer].  Chart creation errors have been sought, but may not always  have been located. Such creation errors do not reflect on  the standard of medical care.

## 2021-04-29 ENCOUNTER — Ambulatory Visit (INDEPENDENT_AMBULATORY_CARE_PROVIDER_SITE_OTHER): Payer: BC Managed Care – PPO | Admitting: Internal Medicine

## 2021-04-29 ENCOUNTER — Encounter: Payer: Self-pay | Admitting: Internal Medicine

## 2021-04-29 VITALS — BP 117/82 | HR 73 | Resp 15 | Ht 62.5 in | Wt 182.0 lb

## 2021-04-29 DIAGNOSIS — M064 Inflammatory polyarthropathy: Secondary | ICD-10-CM | POA: Diagnosis not present

## 2021-04-29 DIAGNOSIS — K219 Gastro-esophageal reflux disease without esophagitis: Secondary | ICD-10-CM | POA: Diagnosis not present

## 2021-04-29 DIAGNOSIS — R21 Rash and other nonspecific skin eruption: Secondary | ICD-10-CM | POA: Diagnosis not present

## 2021-04-29 DIAGNOSIS — M7918 Myalgia, other site: Secondary | ICD-10-CM | POA: Diagnosis not present

## 2021-05-10 ENCOUNTER — Telehealth: Payer: Self-pay

## 2021-05-10 NOTE — Telephone Encounter (Signed)
Patient called stating at her last appointment with Dr. Dimple Casey she was told a prescription strength antacid medication for her acid reflux would be sent to [facility].  Patient states she stopped by the drug store today, 05/10/21 and was told they never received a prescription.  Patient requested a return call.   ?

## 2021-05-13 DIAGNOSIS — K219 Gastro-esophageal reflux disease without esophagitis: Secondary | ICD-10-CM | POA: Insufficient documentation

## 2021-05-13 MED ORDER — PANTOPRAZOLE SODIUM 40 MG PO TBEC: 40.0000 mg | DELAYED_RELEASE_TABLET | Freq: Every day | ORAL | 2 refills | Status: DC

## 2021-05-13 NOTE — Telephone Encounter (Signed)
Rx sent for pantoprazole 40 mg daily. 

## 2021-05-14 NOTE — Telephone Encounter (Signed)
Patient wants referral for OT for pain. Please advise. ?

## 2021-06-07 ENCOUNTER — Encounter: Payer: Self-pay | Admitting: Internal Medicine

## 2021-06-07 NOTE — Telephone Encounter (Signed)
I am okay with referring to OT we discussed that as an option. Chronic pain in right hand and wrist suspect strain or overuse injury.

## 2021-06-09 ENCOUNTER — Other Ambulatory Visit: Payer: Self-pay | Admitting: *Deleted

## 2021-06-09 DIAGNOSIS — M79641 Pain in right hand: Secondary | ICD-10-CM

## 2021-06-09 NOTE — Telephone Encounter (Signed)
OT referral placed.

## 2021-10-18 ENCOUNTER — Other Ambulatory Visit: Payer: Self-pay

## 2021-10-18 DIAGNOSIS — Z0000 Encounter for general adult medical examination without abnormal findings: Secondary | ICD-10-CM

## 2021-10-18 DIAGNOSIS — Z130 Encounter for screening for diseases of the blood and blood-forming organs and certain disorders involving the immune mechanism: Secondary | ICD-10-CM

## 2021-10-21 ENCOUNTER — Other Ambulatory Visit: Payer: BC Managed Care – PPO

## 2021-10-27 NOTE — Progress Notes (Signed)
Established patient visit   Patient: Hannah Stevenson   DOB: [birth year]   41 y.o. Female  MRN: [id number] Visit Date: 10/28/2021   Chief Complaint  Patient presents with   Gynecologic Exam   Annual Exam   Subjective    HPI  Annual physical -due for routine, fasting labs. Orders have been placed. Needs to  have drawn.  -?need for flu vaccine today  -was referred to rheumatology at her most recent visit here.  -had a few visits and was to be referred to occupational therapy.  --four or five months and she has not heard anything from rheumatology since then. No follow ups were scheduled and she never received referral information. There is no referral in Mychart that she can see.  --does still have generalized joint pain. Takes only flexeril on as needed basis and does not take it often. States that it makes her sleepy and groggy the next day. Cannot be groggy at her job.  -she did get prescription for Protonix which does help her with GERD type symptoms.  -also gave her good exercises to use for plantar fasciitis.  -would like to have referral to new rheumatology    Medications: Outpatient Medications Prior to Visit  Medication Sig   cyclobenzaprine (FLEXERIL) 10 MG tablet Take 1 tablet (10 mg total) by mouth at bedtime.   [DISCONTINUED] pantoprazole (PROTONIX) 40 MG tablet Take 1 tablet (40 mg total) by mouth daily.   EPINEPHrine 0.3 mg/0.3 mL IJ SOAJ injection Inject into the muscle. (Patient not taking: Reported on 01/09/2021)   [DISCONTINUED] clobetasol cream (TEMOVATE) 5.10 % Apply 1 application topically 2 (two) times daily. (Patient not taking: Reported on 04/29/2021)   No facility-administered medications prior to visit.    Review of Systems  Constitutional:  Negative for activity change, appetite change, chills, fatigue and fever.  HENT:  Negative for congestion, postnasal drip, rhinorrhea, sinus pressure, sinus pain, sneezing and sore throat.   Eyes: Negative.   Respiratory:   Negative for cough, chest tightness, shortness of breath and wheezing.   Cardiovascular:  Negative for chest pain and palpitations.  Gastrointestinal:  Negative for abdominal pain, constipation, diarrhea, nausea and vomiting.       GERD  Endocrine: Negative for cold intolerance, heat intolerance, polydipsia and polyuria.  Genitourinary:  Negative for dyspareunia, dysuria, flank pain, frequency and urgency.  Musculoskeletal:  Positive for arthralgias and myalgias. Negative for back pain.  Skin:  Negative for rash.  Allergic/Immunologic: Negative for environmental allergies.  Neurological:  Negative for dizziness, weakness and headaches.  Hematological:  Negative for adenopathy.  Psychiatric/Behavioral:  Positive for dysphoric mood. The patient is not nervous/anxious.        Objective     Today's Vitals   10/28/21 0926  BP: 104/64  Pulse: 67  SpO2: 97%  Weight: 189 lb (85.7 kg)  Height: 5' 2.5" (1.588 m)   Body mass index is 34.02 kg/m.  BP Readings from Last 3 Encounters:  10/28/21 104/64  04/29/21 117/82  01/31/21 100/66    Wt Readings from Last 3 Encounters:  10/28/21 189 lb (85.7 kg)  04/29/21 182 lb (82.6 kg)  01/31/21 180 lb 12.8 oz (82 kg)    Physical Exam Vitals and nursing note reviewed. Exam conducted with a chaperone present.  Constitutional:      Appearance: Normal appearance. She is well-developed.  HENT:     Head: Normocephalic.     Right Ear: Tympanic membrane, ear canal and external ear normal.  Left Ear: Tympanic membrane, ear canal and external ear normal.     Nose: Nose normal.     Mouth/Throat:     Mouth: Mucous membranes are moist.     Pharynx: Oropharynx is clear.  Eyes:     Extraocular Movements: Extraocular movements intact.     Conjunctiva/sclera: Conjunctivae normal.     Pupils: Pupils are equal, round, and reactive to light.  Cardiovascular:     Rate and Rhythm: Normal rate and regular rhythm.     Pulses: Normal pulses.     Heart  sounds: Normal heart sounds.  Pulmonary:     Effort: Pulmonary effort is normal.     Breath sounds: Normal breath sounds.  Chest:  Breasts:    Right: Normal. No swelling, bleeding, inverted nipple, mass, nipple discharge, skin change or tenderness.     Left: Normal. No swelling, bleeding, inverted nipple, mass, nipple discharge, skin change or tenderness.  Abdominal:     General: Bowel sounds are normal. There is no distension.     Palpations: Abdomen is soft. There is no mass.     Tenderness: There is no abdominal tenderness. There is no right CVA tenderness, left CVA tenderness, guarding or rebound.     Hernia: No hernia is present. There is no hernia in the left inguinal area or right inguinal area.  Genitourinary:    General: Normal vulva.     Exam position: Supine.     Labia:        Right: No rash, tenderness or injury.        Left: No rash, tenderness, lesion or injury.      Vagina: Normal. No signs of injury and foreign body. No vaginal discharge, erythema, tenderness, bleeding, lesions or prolapsed vaginal walls.     Cervix: No cervical motion tenderness, discharge, friability, lesion, erythema, cervical bleeding or eversion.     Uterus: Normal.      Adnexa: Right adnexa normal and left adnexa normal.     Comments: No tenderness, masses, or organomeglay present during bimanual exam .   Musculoskeletal:        General: Tenderness present. Normal range of motion.     Cervical back: Normal range of motion and neck supple.     Comments: Generalized joint pain without evidence of inflammation or swelling at this time.    Lymphadenopathy:     Cervical: No cervical adenopathy.     Upper Body:     Right upper body: No axillary adenopathy.     Left upper body: No axillary adenopathy.     Lower Body: No right inguinal adenopathy. No left inguinal adenopathy.  Skin:    General: Skin is warm and dry.     Capillary Refill: Capillary refill takes less than 2 seconds.  Neurological:      General: No focal deficit present.     Mental Status: She is alert and oriented to person, place, and time.  Psychiatric:        Mood and Affect: Mood normal.        Behavior: Behavior normal.        Thought Content: Thought content normal.        Judgment: Judgment normal.       Assessment & Plan    1. Encounter for general adult medical examination with abnormal findings Annual physical today   2. Gastroesophageal reflux disease, unspecified whether esophagitis present Continue protonix as previously prescribed per rheumatology. Refills provided today  - pantoprazole (PROTONIX)  40 MG tablet; Take 1 tablet (40 mg total) by mouth daily.  Dispense: 90 tablet; Refill: 1  3. Inflammatory polyarthritis ([place]) Check connective tissue panel. ANA historically elevated. Trial celebrex 200 mg twice daily as needed. Refer to new rheumatologist as indicated.  - ANA w/Reflex if Positive - Sedimentation rate - Rheumatoid Factor - celecoxib (CELEBREX) 200 MG capsule; Take 1 capsule (200 mg total) by mouth 2 (two) times daily.  Dispense: 60 capsule; Refill: 2  4. Screening for endocrine, nutritional, metabolic and immunity disorder Check thyroid panel and screen for DM2 - Comp Met (CMET) - CBC with Differential/Platelet - Hemoglobin A1c - Lipid panel - TSH  5. Encounter for screening mammogram for malignant neoplasm of breast Screening mammogram ordered today  - MM DIGITAL SCREENING BILATERAL; Future  6. Health care maintenance Routine, fasting labs drawn during today's visit.  - Comp Met (CMET) - CBC with Differential/Platelet - Hemoglobin A1c - Lipid panel - TSH    Problem List Items Addressed This Visit       Digestive   Acid reflux   Relevant Medications   pantoprazole (PROTONIX) 40 MG tablet     Musculoskeletal and Integument   Inflammatory polyarthritis ([facility])   Relevant Medications   celecoxib (CELEBREX) 200 MG capsule   Other Relevant Orders   ANA w/Reflex if  Positive (Completed)   Sedimentation rate (Completed)   Rheumatoid Factor (Completed)     Other   Screening for endocrine, nutritional, metabolic and immunity disorder   Other Visit Diagnoses     Encounter for general adult medical examination with abnormal findings    -  Primary   Need for influenza vaccination       Relevant Orders   Flu Vaccine QUAD 6+ mos PF IM (Fluarix Quad PF) (Completed)   Health care maintenance            Return in about 3 months (around 01/28/2022) for added celebrex to meds.         Ronnell Freshwater, NP  [facility] at [facility] [phone] (phone) [phone] (fax)  [place]

## 2021-10-28 ENCOUNTER — Encounter: Payer: Self-pay | Admitting: Nurse Practitioner

## 2021-10-28 ENCOUNTER — Ambulatory Visit (INDEPENDENT_AMBULATORY_CARE_PROVIDER_SITE_OTHER): Payer: BC Managed Care – PPO | Admitting: Nurse Practitioner

## 2021-10-28 VITALS — BP 104/64 | HR 67 | Ht 62.5 in | Wt 189.0 lb

## 2021-10-28 DIAGNOSIS — Z1329 Encounter for screening for other suspected endocrine disorder: Secondary | ICD-10-CM | POA: Diagnosis not present

## 2021-10-28 DIAGNOSIS — R5383 Other fatigue: Secondary | ICD-10-CM

## 2021-10-28 DIAGNOSIS — Z23 Encounter for immunization: Secondary | ICD-10-CM | POA: Diagnosis not present

## 2021-10-28 DIAGNOSIS — K219 Gastro-esophageal reflux disease without esophagitis: Secondary | ICD-10-CM

## 2021-10-28 DIAGNOSIS — Z0000 Encounter for general adult medical examination without abnormal findings: Secondary | ICD-10-CM | POA: Diagnosis not present

## 2021-10-28 DIAGNOSIS — Z0001 Encounter for general adult medical examination with abnormal findings: Secondary | ICD-10-CM | POA: Diagnosis not present

## 2021-10-28 DIAGNOSIS — M064 Inflammatory polyarthropathy: Secondary | ICD-10-CM | POA: Diagnosis not present

## 2021-10-28 DIAGNOSIS — Z1231 Encounter for screening mammogram for malignant neoplasm of breast: Secondary | ICD-10-CM

## 2021-10-28 DIAGNOSIS — Z131 Encounter for screening for diabetes mellitus: Secondary | ICD-10-CM | POA: Diagnosis not present

## 2021-10-28 DIAGNOSIS — Z130 Encounter for screening for diseases of the blood and blood-forming organs and certain disorders involving the immune mechanism: Secondary | ICD-10-CM

## 2021-10-28 MED ORDER — PANTOPRAZOLE SODIUM 40 MG PO TBEC: 40.0000 mg | DELAYED_RELEASE_TABLET | Freq: Every day | ORAL | 1 refills | Status: DC

## 2021-10-28 MED ORDER — CELECOXIB 200 MG PO CAPS: 200.0000 mg | ORAL_CAPSULE | Freq: Two times a day (BID) | ORAL | 2 refills | Status: DC

## 2021-10-29 LAB — CBC WITH DIFFERENTIAL/PLATELET
Basophils Absolute: 0.1 10*3/uL (ref 0.0–0.2)
Basos: 1 %
EOS (ABSOLUTE): 0.2 10*3/uL (ref 0.0–0.4)
Eos: 3 %
Hematocrit: 39.1 % (ref 34.0–46.6)
Hemoglobin: 12.9 g/dL (ref 11.1–15.9)
Immature Grans (Abs): 0 10*3/uL (ref 0.0–0.1)
Immature Granulocytes: 0 %
Lymphocytes Absolute: 1.7 10*3/uL (ref 0.7–3.1)
Lymphs: 18 %
MCH: 29 pg (ref 26.6–33.0)
MCHC: 33 g/dL (ref 31.5–35.7)
MCV: 88 fL (ref 79–97)
Monocytes Absolute: 0.4 10*3/uL (ref 0.1–0.9)
Monocytes: 4 %
Neutrophils Absolute: 7 10*3/uL (ref 1.4–7.0)
Neutrophils: 74 %
Platelets: 324 10*3/uL (ref 150–450)
RBC: 4.45 x10E6/uL (ref 3.77–5.28)
RDW: 12.8 % (ref 11.7–15.4)
WBC: 9.3 10*3/uL (ref 3.4–10.8)

## 2021-10-29 LAB — ANA W/REFLEX IF POSITIVE
Anti JO-1: <0.2 AI (ref 0.0–0.9)
Anti Nuclear Antibody (ANA): POSITIVE — AB
Centromere Ab Screen: 3.9 AI — ABNORMAL HIGH (ref 0.0–0.9)
Chromatin Ab SerPl-aCnc: <0.2 AI (ref 0.0–0.9)
ENA RNP Ab: 0.4 AI (ref 0.0–0.9)
ENA SM Ab Ser-aCnc: <0.2 AI (ref 0.0–0.9)
ENA SSA (RO) Ab: <0.2 AI (ref 0.0–0.9)
ENA SSB (LA) Ab: <0.2 AI (ref 0.0–0.9)
Scleroderma (Scl-70) (ENA) Antibody, IgG: <0.2 AI (ref 0.0–0.9)
dsDNA Ab: <1 IU/mL (ref 0–9)

## 2021-10-29 LAB — COMPREHENSIVE METABOLIC PANEL
ALT: 8 IU/L (ref 0–32)
AST: 14 IU/L (ref 0–40)
Albumin/Globulin Ratio: 1.8 (ref 1.2–2.2)
Albumin: 4.2 g/dL (ref 3.9–4.9)
Alkaline Phosphatase: 79 IU/L (ref 44–121)
BUN/Creatinine Ratio: 17 (ref 9–23)
BUN: 17 mg/dL (ref 6–24)
Bilirubin Total: 0.3 mg/dL (ref 0.0–1.2)
CO2: 19 mmol/L — ABNORMAL LOW (ref 20–29)
Calcium: 8.8 mg/dL (ref 8.7–10.2)
Chloride: 106 mmol/L (ref 96–106)
Creatinine, Ser: 1 mg/dL (ref 0.57–1.00)
Globulin, Total: 2.3 g/dL (ref 1.5–4.5)
Glucose: 106 mg/dL — ABNORMAL HIGH (ref 70–99)
Potassium: 4.7 mmol/L (ref 3.5–5.2)
Sodium: 140 mmol/L (ref 134–144)
Total Protein: 6.5 g/dL (ref 6.0–8.5)
eGFR: 72 mL/min/{1.73_m2} (ref >59)

## 2021-10-29 LAB — LIPID PANEL
Chol/HDL Ratio: 5.2 ratio — ABNORMAL HIGH (ref 0.0–4.4)
Cholesterol, Total: 177 mg/dL (ref 100–199)
HDL: 34 mg/dL — ABNORMAL LOW (ref >39)
LDL Chol Calc (NIH): 127 mg/dL — ABNORMAL HIGH (ref 0–99)
Triglycerides: 88 mg/dL (ref 0–149)
VLDL Cholesterol Cal: 16 mg/dL (ref 5–40)

## 2021-10-29 LAB — HEMOGLOBIN A1C
Est. average glucose Bld gHb Est-mCnc: 111 mg/dL
Hgb A1c MFr Bld: 5.5 % (ref 4.8–5.6)

## 2021-10-29 LAB — SEDIMENTATION RATE: Sed Rate: 20 mm/hr (ref 0–32)

## 2021-10-29 LAB — TSH: TSH: 2.11 u[IU]/mL (ref 0.450–4.500)

## 2021-10-29 LAB — RHEUMATOID FACTOR: Rheumatoid fact SerPl-aCnc: <10 IU/mL (ref <14.0)

## 2021-11-05 ENCOUNTER — Telehealth: Payer: Self-pay

## 2021-11-05 ENCOUNTER — Other Ambulatory Visit: Payer: Self-pay | Admitting: Nurse Practitioner

## 2021-11-05 DIAGNOSIS — R768 Other specified abnormal immunological findings in serum: Secondary | ICD-10-CM

## 2021-11-05 DIAGNOSIS — M064 Inflammatory polyarthropathy: Secondary | ICD-10-CM

## 2021-11-05 NOTE — Progress Notes (Signed)
ANA was positive with the centromere AB elevated. I am not sure the significance of this, but I have put in new referral to new rheumatologist for her to have second opinion. Her bad cholesterol was just a little elevated. I Recommend patient limit intake of fried and fatty foods. She should increase intake of lean proteins and green leafy vegetables. Adding exercise into daily routine will also be beneficial. All other labs were great, patient to be notified of findings

## 2021-11-05 NOTE — Telephone Encounter (Signed)
Please let the patient know that Her ANA was positive with the centromere AB elevated. I am not sure the significance of this, but I have put in new referral to new rheumatologist for her to have second opinion. Her bad cholesterol was just a little elevated. I Recommend patient limit intake of fried and fatty foods. She should increase intake of lean proteins and green leafy vegetables. Adding exercise into daily routine will also be beneficial. All other labs were great,  Thanks so much.   -HB

## 2021-11-05 NOTE — Telephone Encounter (Signed)
Pt is calling requesting lab results.   I didn't see the result note. I advised her that we would give her a call back once that was entered.   Please advise.

## 2021-11-05 NOTE — Telephone Encounter (Signed)
Great! Thank you so much!

## 2021-11-18 ENCOUNTER — Ambulatory Visit
Admission: RE | Admit: 2021-11-18 | Discharge: 2021-11-18 | Disposition: A | Discharge status: Home / Self Care | Payer: BC Managed Care – PPO | Source: Ambulatory Visit | Attending: Nurse Practitioner | Admitting: Nurse Practitioner

## 2021-11-18 DIAGNOSIS — Z1231 Encounter for screening mammogram for malignant neoplasm of breast: Secondary | ICD-10-CM

## 2021-11-19 NOTE — Progress Notes (Signed)
Additional images to be ordered and scheduled.

## 2021-11-20 ENCOUNTER — Other Ambulatory Visit: Payer: Self-pay | Admitting: Nurse Practitioner

## 2021-11-20 DIAGNOSIS — R928 Other abnormal and inconclusive findings on diagnostic imaging of breast: Secondary | ICD-10-CM

## 2021-12-26 ENCOUNTER — Encounter: Payer: Self-pay | Admitting: Nurse Practitioner

## 2021-12-26 NOTE — Telephone Encounter (Signed)
Can you contact the patient to schedule an acute appointment? Thank you.

## 2022-01-01 ENCOUNTER — Telehealth: Payer: Self-pay | Admitting: *Deleted

## 2022-01-01 NOTE — Telephone Encounter (Signed)
Could you place a new referral to rheumatology for this patient.  She said the original was sent to her previous rheumatologist and she wanted to go to another place. Soham Hollett Zimmerman Rumple, CMA

## 2022-01-07 NOTE — Telephone Encounter (Signed)
Please let her know that I think that a referral for a different practice would be [facility] or [facility]. Does she have a preference? Is there a specific rheumatology provider she would like to see?

## 2022-01-08 ENCOUNTER — Ambulatory Visit: Payer: BC Managed Care – PPO | Admitting: Nurse Practitioner

## 2022-01-14 ENCOUNTER — Other Ambulatory Visit: Payer: Self-pay | Admitting: Nurse Practitioner

## 2022-01-14 DIAGNOSIS — R768 Other specified abnormal immunological findings in serum: Secondary | ICD-10-CM

## 2022-01-14 DIAGNOSIS — M064 Inflammatory polyarthropathy: Secondary | ICD-10-CM

## 2022-01-14 NOTE — Telephone Encounter (Signed)
I have just placed a new referral to rheumatology in her chart.

## 2022-01-14 NOTE — Telephone Encounter (Signed)
If you will place the referral I will figure out which office she would like to go to and then place it in the referral.

## 2022-01-15 NOTE — Telephone Encounter (Signed)
Referral was sent to [place]. Ranson Belluomini Zimmerman Rumple, CMA

## 2022-01-16 ENCOUNTER — Encounter: Payer: Self-pay | Admitting: Nurse Practitioner

## 2022-01-16 ENCOUNTER — Ambulatory Visit: Payer: BC Managed Care – PPO | Admitting: Nurse Practitioner

## 2022-01-16 VITALS — BP 101/61 | HR 64 | Ht 62.5 in | Wt 190.4 lb

## 2022-01-16 DIAGNOSIS — L03111 Cellulitis of right axilla: Secondary | ICD-10-CM

## 2022-01-16 MED ORDER — AMOXICILLIN-POT CLAVULANATE 875-125 MG PO TABS: 1.0000 | ORAL_TABLET | Freq: Two times a day (BID) | ORAL | 0 refills | Status: DC

## 2022-01-16 NOTE — Progress Notes (Signed)
Established patient visit   Patient: Hannah Stevenson   DOB: May 17, 1980   42 y.o. Female  MRN: [id number] Visit Date: 01/16/2022  Chief Complaint  Patient presents with   Arm Pain   Subjective    Patient has spot under her right arm which is tender and swollen  -has been present for about 1.5 weeks  -unchanged in size and swelling  -pai is getting more severe.  -denies fever    Arm Pain  The incident occurred more than 1 week ago. Pertinent negatives include no chest pain.      Medications: Outpatient Medications Prior to Visit  Medication Sig   celecoxib (CELEBREX) 200 MG capsule Take 1 capsule (200 mg total) by mouth 2 (two) times daily.   pantoprazole (PROTONIX) 40 MG tablet Take 1 tablet (40 mg total) by mouth daily.   [DISCONTINUED] cyclobenzaprine (FLEXERIL) 10 MG tablet Take 1 tablet (10 mg total) by mouth at bedtime.   EPINEPHrine 0.3 mg/0.3 mL IJ SOAJ injection Inject into the muscle. (Patient not taking: Reported on 01/09/2021)   No facility-administered medications prior to visit.    Review of Systems  Constitutional:  Negative for activity change, appetite change, chills, fatigue and fever.  HENT:  Negative for congestion, postnasal drip, rhinorrhea, sinus pressure, sinus pain, sneezing and sore throat.   Eyes: Negative.   Respiratory:  Negative for cough, chest tightness, shortness of breath and wheezing.   Cardiovascular:  Negative for chest pain and palpitations.  Gastrointestinal:  Negative for abdominal pain, constipation, diarrhea, nausea and vomiting.  Endocrine: Negative for cold intolerance, heat intolerance, polydipsia and polyuria.  Genitourinary:  Negative for dyspareunia, dysuria, flank pain, frequency and urgency.  Musculoskeletal:  Negative for arthralgias, back pain and myalgias.  Skin:  Negative for rash.       Tender lump under the skin of right arm.   Allergic/Immunologic: Negative for environmental allergies.  Neurological:  Negative for  dizziness, weakness and headaches.  Hematological:  Negative for adenopathy.  Psychiatric/Behavioral:  The patient is not nervous/anxious.      Objective     Today's Vitals   01/16/22 1006  BP: 101/61  Pulse: 64  SpO2: 98%  Weight: 190 lb 6.4 oz (86.4 kg)  Height: 5' 2.5" (1.588 m)   Body mass index is 34.27 kg/m.   Physical Exam Vitals and nursing note reviewed.  Constitutional:      Appearance: Normal appearance. She is well-developed.  HENT:     Head: Normocephalic.  Eyes:     Pupils: Pupils are equal, round, and reactive to light.  Cardiovascular:     Rate and Rhythm: Normal rate and regular rhythm.     Pulses: Normal pulses.     Heart sounds: Normal heart sounds.  Pulmonary:     Effort: Pulmonary effort is normal.     Breath sounds: Normal breath sounds.  Abdominal:     Palpations: Abdomen is soft.  Musculoskeletal:        General: Normal range of motion.     Cervical back: Normal range of motion and neck supple.  Lymphadenopathy:     Cervical: No cervical adenopathy.  Skin:    General: Skin is warm and dry.     Capillary Refill: Capillary refill takes less than 2 seconds.     Comments: Tender and superficial mass just distal of the right axilla. Area is red and slightly warm to palpate. No break in skin or drainage noted at this time  Neurological:  General: No focal deficit present.     Mental Status: She is alert and oriented to person, place, and time.  Psychiatric:        Mood and Affect: Mood normal.        Behavior: Behavior normal.        Thought Content: Thought content normal.        Judgment: Judgment normal.      Assessment & Plan     1. Cellulitis of axilla, right Start augmentin 875 mg twice daily for next 10 days. Encouraged her to apply warm compress to area to encourage drainage of infectious fluid. Recommended she take tylenol and/or ibuprofen for pain. She understands she should contact the office for worsening or persistent  symptoms.    Return for prn worsening or persistent symptoms.        Ronnell Freshwater, NP  [facility] Primary Care at [facility] [phone] (phone) [phone] (fax)  [place]

## 2022-01-28 ENCOUNTER — Ambulatory Visit: Payer: BC Managed Care – PPO | Admitting: Nurse Practitioner

## 2022-01-28 ENCOUNTER — Encounter: Payer: Self-pay | Admitting: Nurse Practitioner

## 2022-01-28 VITALS — BP 109/66 | HR 70 | Ht 62.5 in | Wt 190.1 lb

## 2022-01-28 DIAGNOSIS — R768 Other specified abnormal immunological findings in serum: Secondary | ICD-10-CM

## 2022-01-28 DIAGNOSIS — L03111 Cellulitis of right axilla: Secondary | ICD-10-CM | POA: Diagnosis not present

## 2022-01-28 DIAGNOSIS — M064 Inflammatory polyarthropathy: Secondary | ICD-10-CM | POA: Diagnosis not present

## 2022-01-28 DIAGNOSIS — B379 Candidiasis, unspecified: Secondary | ICD-10-CM

## 2022-01-28 DIAGNOSIS — T3695XA Adverse effect of unspecified systemic antibiotic, initial encounter: Secondary | ICD-10-CM

## 2022-01-28 MED ORDER — FLUCONAZOLE 150 MG PO TABS: ORAL_TABLET | ORAL | 0 refills | Status: DC

## 2022-01-28 MED ORDER — SULFAMETHOXAZOLE-TRIMETHOPRIM 800-160 MG PO TABS: 1.0000 | ORAL_TABLET | Freq: Two times a day (BID) | ORAL | 0 refills | Status: DC

## 2022-01-28 NOTE — Progress Notes (Signed)
Established patient visit   Patient: Hannah Stevenson   DOB: 1980/09/07   42 y.o. Female  MRN: [id number] Visit Date: 01/28/2022   Chief Complaint  Patient presents with   Follow-up   Subjective    HPI  Follow up  -added celebrex to help with generalized joint pain.  --pain now manageable.  --new referral made to different rheumatology referral. Waiting to hear something to have further evaluation and treatment.    Medications: Outpatient Medications Prior to Visit  Medication Sig   celecoxib (CELEBREX) 200 MG capsule Take 1 capsule (200 mg total) by mouth 2 (two) times daily.   pantoprazole (PROTONIX) 40 MG tablet Take 1 tablet (40 mg total) by mouth daily.   EPINEPHrine 0.3 mg/0.3 mL IJ SOAJ injection Inject into the muscle. (Patient not taking: Reported on 01/09/2021)   [DISCONTINUED] amoxicillin-clavulanate (AUGMENTIN) 875-125 MG tablet Take 1 tablet by mouth 2 (two) times daily.   [DISCONTINUED] cyclobenzaprine (FLEXERIL) 10 MG tablet Take 1 tablet (10 mg total) by mouth at bedtime.   No facility-administered medications prior to visit.    Review of Systems  Constitutional:  Positive for fatigue. Negative for activity change, appetite change, chills and fever.  HENT:  Negative for congestion, postnasal drip, rhinorrhea, sinus pressure, sinus pain, sneezing and sore throat.   Eyes: Negative.   Respiratory:  Negative for cough, chest tightness, shortness of breath and wheezing.   Cardiovascular:  Negative for chest pain and palpitations.  Gastrointestinal:  Negative for abdominal pain, constipation, diarrhea, nausea and vomiting.       GERD  Endocrine: Negative for cold intolerance, heat intolerance, polydipsia and polyuria.  Genitourinary:  Negative for dyspareunia, dysuria, flank pain, frequency and urgency.  Musculoskeletal:  Positive for arthralgias and myalgias. Negative for back pain.  Skin:  Negative for rash.  Allergic/Immunologic: Negative for environmental allergies.   Neurological:  Positive for headaches. Negative for dizziness and weakness.  Hematological:  Negative for adenopathy.  Psychiatric/Behavioral:  Positive for dysphoric mood. The patient is not nervous/anxious.     Last CBC Lab Results  Component Value Date   WBC 9.3 10/28/2021   HGB 12.9 10/28/2021   HCT 39.1 10/28/2021   MCV 88 10/28/2021   MCH 29.0 10/28/2021   RDW 12.8 10/28/2021   PLT 324 [id number]   Last metabolic panel Lab Results  Component Value Date   GLUCOSE 106 (H) 10/28/2021   NA 140 10/28/2021   K 4.7 10/28/2021   CL 106 10/28/2021   CO2 19 (L) 10/28/2021   BUN 17 10/28/2021   CREATININE 1.00 10/28/2021   EGFR 72 10/28/2021   CALCIUM 8.8 10/28/2021   PROT 6.5 10/28/2021   ALBUMIN 4.2 10/28/2021   LABGLOB 2.3 10/28/2021   AGRATIO 1.8 10/28/2021   BILITOT 0.3 10/28/2021   ALKPHOS 79 10/28/2021   AST 14 10/28/2021   ALT 8 10/28/2021   ANIONGAP 7 07/25/2020   Last lipids Lab Results  Component Value Date   CHOL 177 10/28/2021   HDL 34 (L) 10/28/2021   LDLCALC 127 (H) 10/28/2021   TRIG 88 10/28/2021   CHOLHDL 5.2 (H) 10/28/2021   Last hemoglobin A1c Lab Results  Component Value Date   HGBA1C 5.5 10/28/2021   Last thyroid functions Lab Results  Component Value Date   TSH 2.110 10/28/2021        Objective     Today's Vitals   01/28/22 0951  BP: 109/66  Pulse: 70  SpO2: 97%  Weight: 190 lb 1.9 oz (86.2  kg)  Height: 5' 2.5" (1.588 m)   Body mass index is 34.22 kg/m.  BP Readings from Last 3 Encounters:  01/28/22 109/66  01/16/22 101/61  10/28/21 104/64    Wt Readings from Last 3 Encounters:  01/28/22 190 lb 1.9 oz (86.2 kg)  01/16/22 190 lb 6.4 oz (86.4 kg)  10/28/21 189 lb (85.7 kg)    Physical Exam Vitals and nursing note reviewed.  Constitutional:      Appearance: Normal appearance. She is well-developed.  HENT:     Head: Normocephalic and atraumatic.     Nose: Nose normal.     Mouth/Throat:     Mouth: Mucous  membranes are moist.     Pharynx: Oropharynx is clear.  Eyes:     Extraocular Movements: Extraocular movements intact.     Conjunctiva/sclera: Conjunctivae normal.     Pupils: Pupils are equal, round, and reactive to light.  Cardiovascular:     Rate and Rhythm: Normal rate and regular rhythm.     Pulses: Normal pulses.     Heart sounds: Normal heart sounds.  Pulmonary:     Effort: Pulmonary effort is normal.     Breath sounds: Normal breath sounds.  Abdominal:     Palpations: Abdomen is soft.  Musculoskeletal:        General: Normal range of motion.     Cervical back: Normal range of motion and neck supple.     Comments: Generalized joint pain without point tenderness present.   Lymphadenopathy:     Cervical: No cervical adenopathy.  Skin:    General: Skin is warm and dry.     Capillary Refill: Capillary refill takes less than 2 seconds.  Neurological:     General: No focal deficit present.     Mental Status: She is alert and oriented to person, place, and time.  Psychiatric:        Mood and Affect: Mood normal.        Behavior: Behavior normal.        Thought Content: Thought content normal.        Judgment: Judgment normal.       Assessment & Plan    1. Cellulitis of axilla, right Improved but not resolved. Take bactrim DS twice daily for next 2 weeks. Keep area clean and dry. Apply warm compress in 20 minute intervals to promote drainage of infectious fluid.  - sulfamethoxazole-trimethoprim (BACTRIM DS) 800-160 MG tablet; Take 1 tablet by mouth 2 (two) times daily.  Dispense: 28 tablet; Refill: 0  2. Antibiotic-induced yeast infection Take diflucan 150 mg once if symptoms of yeast infection develops. May repeat in 3 days for persistent or recurrent symptoms.  - fluconazole (DIFLUCAN) 150 MG tablet; Take 1 tablet po once. May repeat dose in 3 days as needed for persistent symptoms.  Dispense: 3 tablet; Refill: 0  3. Inflammatory polyarthritis ([place]) Continue celebrex  twice daily as needed for pain/inflammation. Check new rheumatology referral   4. Positive ANA (antinuclear antibody) Continue celebrex twice daily as needed for pain/inflammation. Check new rheumatology referral    Problem List Items Addressed This Visit       Musculoskeletal and Integument   Inflammatory polyarthritis ([place])     Other   Positive ANA (antinuclear antibody)   Antibiotic-induced yeast infection   Relevant Medications   sulfamethoxazole-trimethoprim (BACTRIM DS) 800-160 MG tablet   fluconazole (DIFLUCAN) 150 MG tablet   Other Visit Diagnoses     Cellulitis of axilla, right    -  Primary   Relevant Medications   sulfamethoxazole-trimethoprim (BACTRIM DS) 800-160 MG tablet        Return in about 9 months (around 10/29/2022) for health maintenance exam, FBW a week prior to visit.         Ronnell Freshwater, NP  [facility] [phone] (phone) [phone] (fax)  [place]

## 2022-02-03 ENCOUNTER — Ambulatory Visit
Admission: RE | Admit: 2022-02-03 | Discharge: 2022-02-03 | Disposition: A | Discharge status: Home / Self Care | Payer: BC Managed Care – PPO | Source: Ambulatory Visit | Attending: Nurse Practitioner | Admitting: Nurse Practitioner

## 2022-02-03 ENCOUNTER — Encounter: Payer: Self-pay | Admitting: Nurse Practitioner

## 2022-02-03 DIAGNOSIS — R928 Other abnormal and inconclusive findings on diagnostic imaging of breast: Secondary | ICD-10-CM

## 2022-02-03 DIAGNOSIS — N6002 Solitary cyst of left breast: Secondary | ICD-10-CM | POA: Diagnosis not present

## 2022-02-03 DIAGNOSIS — N6489 Other specified disorders of breast: Secondary | ICD-10-CM | POA: Diagnosis not present

## 2022-02-03 NOTE — Progress Notes (Signed)
MyChart message sent to patient -   breast imaging done was all benign. There is a small left breast cyst which will continue to be monitored. Recommendation is to have screening mammogram in one year.

## 2022-02-03 NOTE — Progress Notes (Signed)
MyChart message sent to patient -   breast imaging done was all benign. There is a small left breast cyst which will continue to be monitored. Recommendation is to have screening mammogram in one year.

## 2022-02-16 DIAGNOSIS — L03111 Cellulitis of right axilla: Secondary | ICD-10-CM | POA: Insufficient documentation

## 2022-02-28 DIAGNOSIS — B379 Candidiasis, unspecified: Secondary | ICD-10-CM | POA: Insufficient documentation

## 2022-04-23 ENCOUNTER — Other Ambulatory Visit: Payer: Self-pay

## 2022-04-23 ENCOUNTER — Telehealth: Payer: Self-pay

## 2022-04-23 DIAGNOSIS — K219 Gastro-esophageal reflux disease without esophagitis: Secondary | ICD-10-CM

## 2022-04-23 DIAGNOSIS — M064 Inflammatory polyarthropathy: Secondary | ICD-10-CM

## 2022-04-23 MED ORDER — CELECOXIB 200 MG PO CAPS
200.0000 mg | ORAL_CAPSULE | Freq: Two times a day (BID) | ORAL | 2 refills | Status: AC
Start: 2022-04-23 — End: ?

## 2022-04-23 MED ORDER — PANTOPRAZOLE SODIUM 40 MG PO TBEC: 40.0000 mg | DELAYED_RELEASE_TABLET | Freq: Every day | ORAL | 1 refills | Status: DC

## 2022-04-23 NOTE — Telephone Encounter (Signed)
Rx's has been sent to [facility]

## 2022-04-23 NOTE — Telephone Encounter (Signed)
Pt is requesting refill on: pantoprazole (PROTONIX) 40 MG tablet  celecoxib (CELEBREX) 200 MG capsule   Pharmacy: [facility] - [place], [place] - [address]   LOV 01/28/22 ROV 10/31/22

## 2022-07-04 DIAGNOSIS — R112 Nausea with vomiting, unspecified: Secondary | ICD-10-CM | POA: Diagnosis not present

## 2022-07-04 DIAGNOSIS — R519 Headache, unspecified: Secondary | ICD-10-CM | POA: Diagnosis not present

## 2022-07-04 DIAGNOSIS — B86 Scabies: Secondary | ICD-10-CM | POA: Diagnosis not present

## 2022-07-04 DIAGNOSIS — Z20822 Contact with and (suspected) exposure to covid-19: Secondary | ICD-10-CM | POA: Diagnosis not present

## 2022-07-08 DIAGNOSIS — R1110 Vomiting, unspecified: Secondary | ICD-10-CM | POA: Diagnosis not present

## 2022-07-08 DIAGNOSIS — R1032 Left lower quadrant pain: Secondary | ICD-10-CM | POA: Diagnosis not present

## 2022-07-08 DIAGNOSIS — D72829 Elevated white blood cell count, unspecified: Secondary | ICD-10-CM | POA: Diagnosis not present

## 2022-07-08 DIAGNOSIS — N201 Calculus of ureter: Secondary | ICD-10-CM | POA: Diagnosis not present

## 2022-07-08 DIAGNOSIS — M797 Fibromyalgia: Secondary | ICD-10-CM | POA: Diagnosis not present

## 2022-07-08 DIAGNOSIS — Z87891 Personal history of nicotine dependence: Secondary | ICD-10-CM | POA: Diagnosis not present

## 2022-07-08 DIAGNOSIS — Z79899 Other long term (current) drug therapy: Secondary | ICD-10-CM | POA: Diagnosis not present

## 2022-07-08 DIAGNOSIS — R109 Unspecified abdominal pain: Secondary | ICD-10-CM | POA: Diagnosis not present

## 2022-07-08 DIAGNOSIS — R1031 Right lower quadrant pain: Secondary | ICD-10-CM | POA: Diagnosis not present

## 2022-07-09 DIAGNOSIS — N201 Calculus of ureter: Secondary | ICD-10-CM | POA: Diagnosis not present

## 2022-07-22 DIAGNOSIS — R112 Nausea with vomiting, unspecified: Secondary | ICD-10-CM | POA: Diagnosis not present

## 2022-07-22 DIAGNOSIS — M797 Fibromyalgia: Secondary | ICD-10-CM | POA: Diagnosis not present

## 2022-07-22 DIAGNOSIS — N200 Calculus of kidney: Secondary | ICD-10-CM | POA: Diagnosis not present

## 2022-07-22 DIAGNOSIS — R1012 Left upper quadrant pain: Secondary | ICD-10-CM | POA: Diagnosis not present

## 2022-07-22 DIAGNOSIS — N132 Hydronephrosis with renal and ureteral calculous obstruction: Secondary | ICD-10-CM | POA: Diagnosis not present

## 2022-07-22 DIAGNOSIS — Z87891 Personal history of nicotine dependence: Secondary | ICD-10-CM | POA: Diagnosis not present

## 2022-07-22 DIAGNOSIS — R109 Unspecified abdominal pain: Secondary | ICD-10-CM | POA: Diagnosis not present

## 2022-07-22 DIAGNOSIS — Z87442 Personal history of urinary calculi: Secondary | ICD-10-CM | POA: Diagnosis not present

## 2022-07-28 DIAGNOSIS — N200 Calculus of kidney: Secondary | ICD-10-CM | POA: Diagnosis not present

## 2022-08-07 DIAGNOSIS — N200 Calculus of kidney: Secondary | ICD-10-CM | POA: Diagnosis not present

## 2022-08-11 IMAGING — DX DG HAND COMPLETE 3+V*R*
3 series · 3 of 3 positions shown · non-contrast
Comparison: 08/13/2019

CLINICAL DATA: Hand pain

EXAM:
RIGHT HAND - COMPLETE 3+ VIEW

[hand pa]
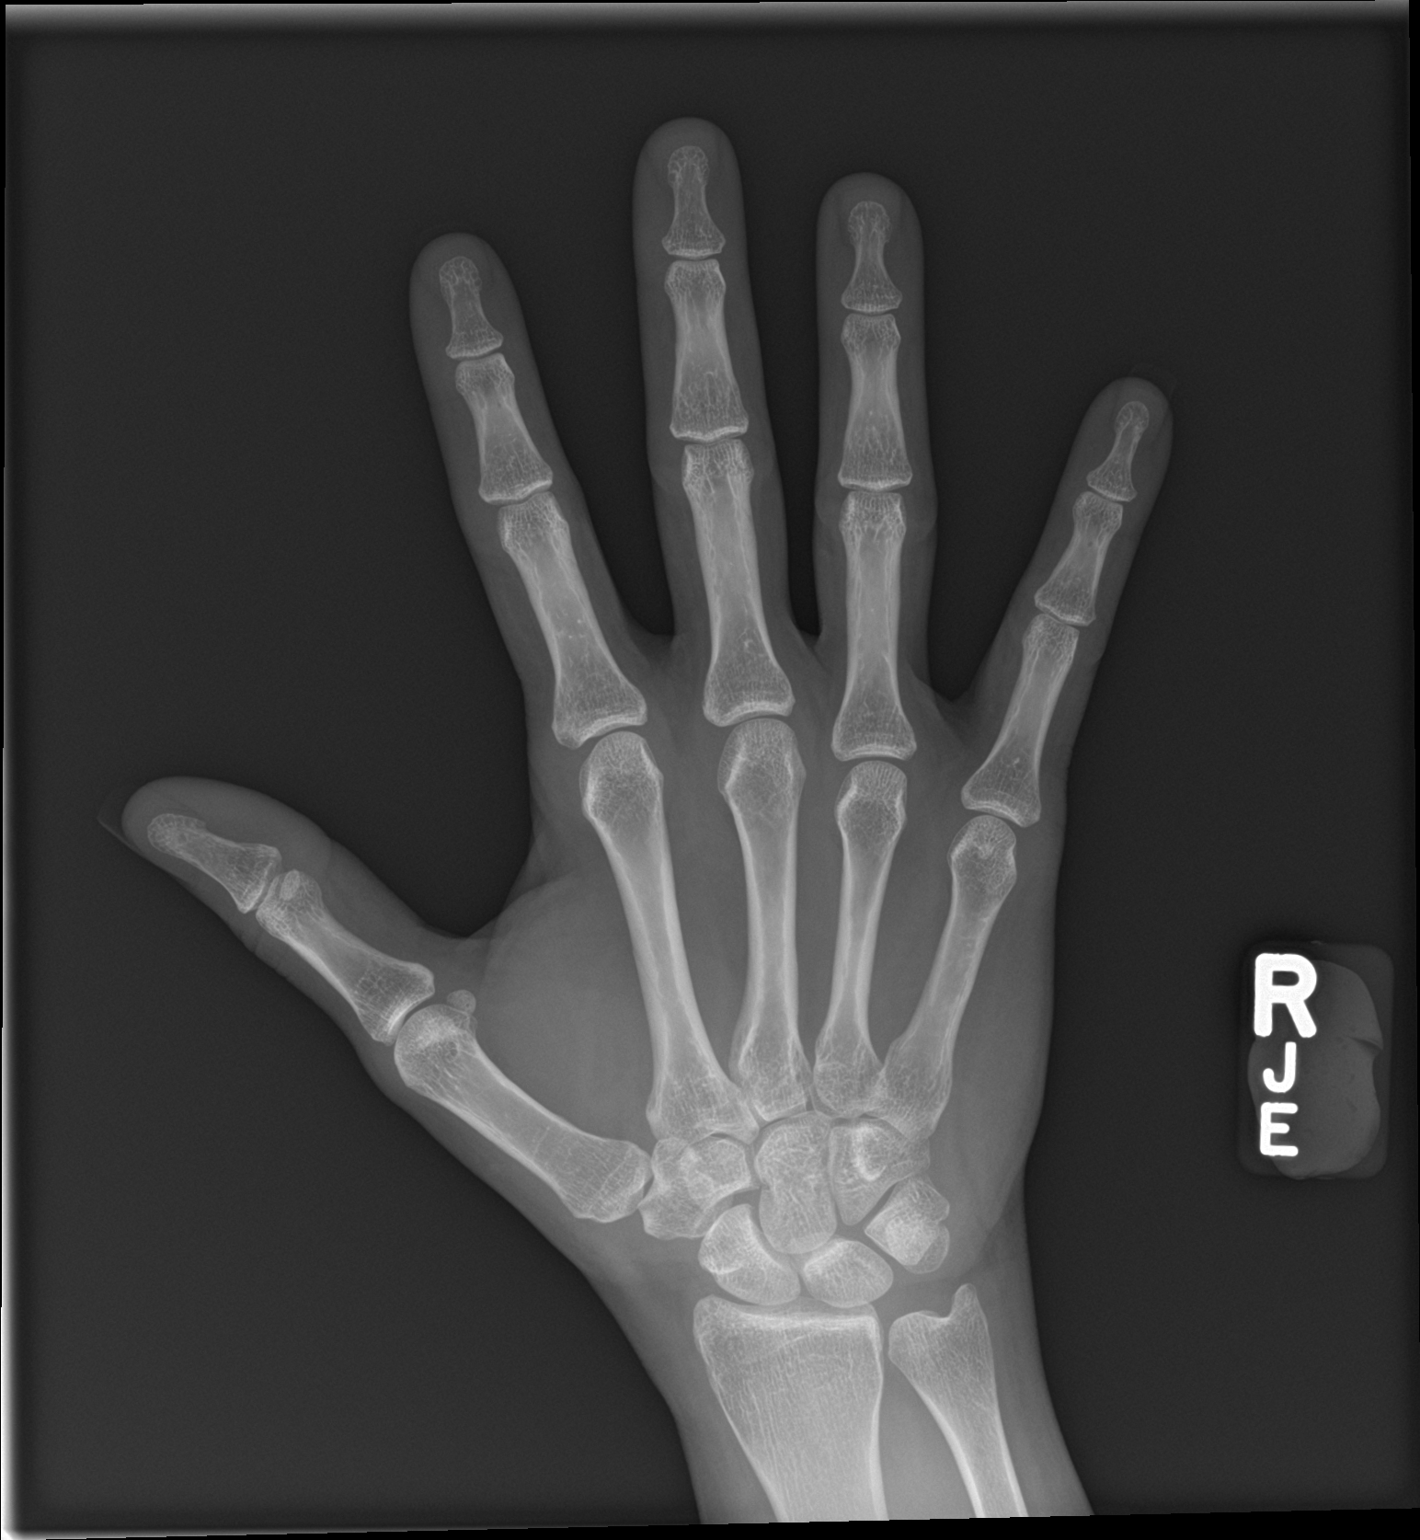

[hand obl]
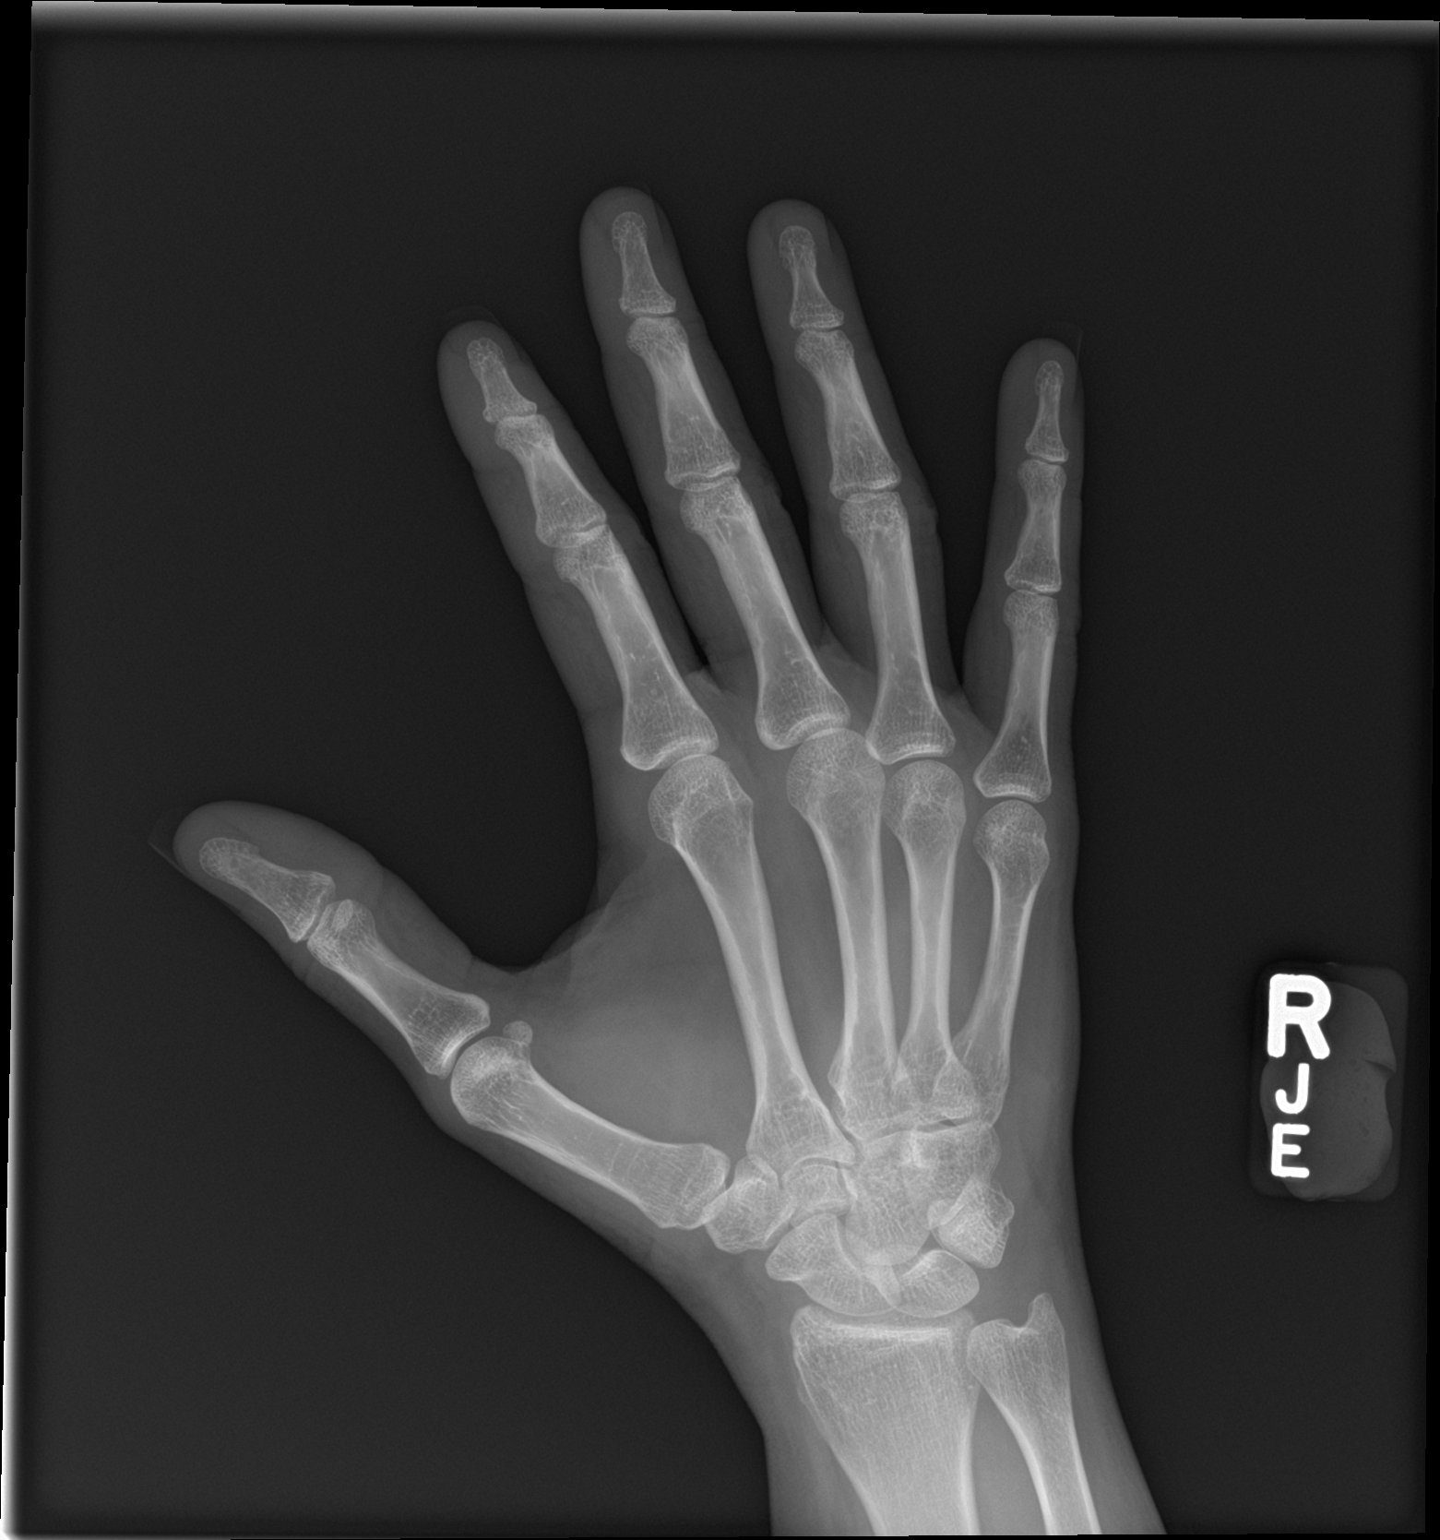

[hand lat]
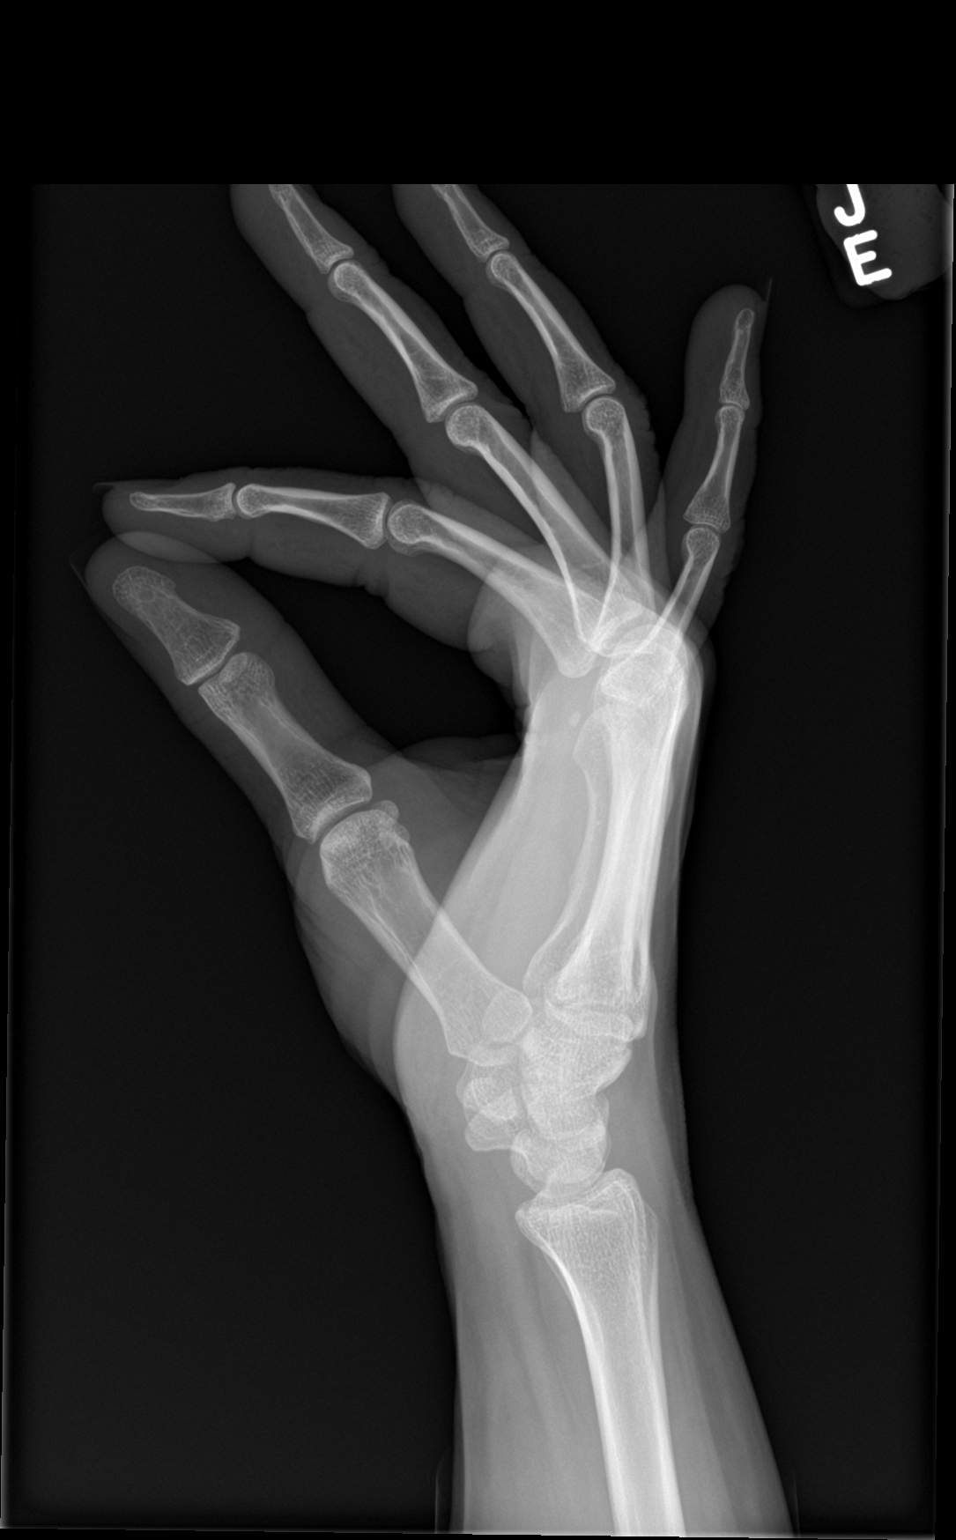

[3 of 3 positions shown; findings below may reference images not displayed]

FINDINGS: There is no evidence of fracture or dislocation. There is no
evidence of arthropathy or other focal bone abnormality. Soft
tissues are unremarkable.
IMPRESSION: Negative.

## 2022-08-20 DIAGNOSIS — N200 Calculus of kidney: Secondary | ICD-10-CM | POA: Diagnosis not present

## 2022-08-28 DIAGNOSIS — M797 Fibromyalgia: Secondary | ICD-10-CM | POA: Diagnosis not present

## 2022-08-28 DIAGNOSIS — N200 Calculus of kidney: Secondary | ICD-10-CM | POA: Diagnosis not present

## 2022-08-28 DIAGNOSIS — E669 Obesity, unspecified: Secondary | ICD-10-CM | POA: Diagnosis not present

## 2022-08-28 DIAGNOSIS — Z6835 Body mass index (BMI) 35.0-35.9, adult: Secondary | ICD-10-CM | POA: Diagnosis not present

## 2022-08-28 DIAGNOSIS — N2889 Other specified disorders of kidney and ureter: Secondary | ICD-10-CM | POA: Diagnosis not present

## 2022-08-28 DIAGNOSIS — G8929 Other chronic pain: Secondary | ICD-10-CM | POA: Diagnosis not present

## 2022-10-09 ENCOUNTER — Other Ambulatory Visit: Payer: Self-pay | Admitting: Nurse Practitioner

## 2022-10-09 ENCOUNTER — Other Ambulatory Visit: Payer: Self-pay | Admitting: Family Medicine

## 2022-10-09 DIAGNOSIS — M064 Inflammatory polyarthropathy: Secondary | ICD-10-CM

## 2022-10-09 MED ORDER — CELECOXIB 200 MG PO CAPS: 200.0000 mg | ORAL_CAPSULE | Freq: Two times a day (BID) | ORAL | 2 refills | Status: AC

## 2022-10-21 ENCOUNTER — Other Ambulatory Visit: Payer: Self-pay

## 2022-10-21 DIAGNOSIS — R5383 Other fatigue: Secondary | ICD-10-CM

## 2022-10-21 DIAGNOSIS — Z0000 Encounter for general adult medical examination without abnormal findings: Secondary | ICD-10-CM

## 2022-10-23 ENCOUNTER — Other Ambulatory Visit: Payer: BC Managed Care – PPO

## 2022-10-31 ENCOUNTER — Encounter: Payer: BC Managed Care – PPO | Admitting: Nurse Practitioner

## 2022-10-31 ENCOUNTER — Encounter: Payer: BC Managed Care – PPO | Admitting: Family Medicine

## 2022-10-31 NOTE — Progress Notes (Deleted)
Complete physical exam  Patient: Hannah Stevenson   DOB: September 12, 1980   42 y.o. Female  MRN: [id number]  Subjective:    No chief complaint on file.   Hannah Stevenson is a 42 y.o. female who presents today for a complete physical exam. She reports consuming a {diet types:17450} diet. {types:19826} She generally feels {DESC; WELL/FAIRLY WELL/POORLY:18703}. She reports sleeping {DESC; WELL/FAIRLY WELL/POORLY:18703}. She {does/does not:200015} have additional problems to discuss today.    Most recent fall risk assessment:    11/27/2020   11:03 AM  Fall Risk   Falls in the past year? 0  Number falls in past yr: 0  Injury with Fall? 0  Follow up Falls evaluation completed     Most recent depression and anxiety screenings:    01/28/2022    9:55 AM 01/16/2022   10:12 AM  PHQ 2/9 Scores  PHQ - 2 Score 2 2  PHQ- 9 Score 3 12      01/28/2022    9:55 AM 01/16/2022   10:13 AM 10/28/2021    9:27 AM 11/27/2020   11:03 AM  GAD 7 : Generalized Anxiety Score  Nervous, Anxious, on Edge 0 1 1 1   Control/stop worrying 0 1 1 2   Worry too much - different things 0 1 1 2   Trouble relaxing 0 1 1 2   Restless 0 0 0 0  Easily annoyed or irritable 0 1 1 1   Afraid - awful might happen 0 0 1 1  Total GAD 7 Score 0 5 6 9     Patient Active Problem List   Diagnosis Date Noted   Acid reflux 05/13/2021   Myofascial pain 01/31/2021   Bilateral plantar fasciitis 01/09/2021   Positive ANA (antinuclear antibody) 01/09/2021   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/11/2020   Leukocytosis 11/11/2020   Inflammatory polyarthritis (HCC) 10/17/2020   Family history of rheumatoid arthritis 10/17/2020   Other fatigue 10/17/2020   Impaired fasting glucose 10/17/2020   Vitamin D deficiency 10/17/2020   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 10/17/2020   Ex-smoker for less than 1 year 10/17/2020    Past Surgical History:  Procedure Laterality Date   lithrotripsy     TUBAL LIGATION     Social History   Tobacco Use    Smoking status: Former    Current packs/day: 0.00    Average packs/day: 1 pack/day for 5.0 years (5.0 ttl pk-yrs)    Types: Cigarettes    Start date: 09/2015    Quit date: 09/2020    Years since quitting: 2.1   Smokeless tobacco: Never  Vaping Use   Vaping status: Never Used  Substance Use Topics   Alcohol use: Not Currently   Drug use: No   Family History  Problem Relation Age of Onset   Melanoma Maternal Grandmother    BRCA 1/2 Neg Hx    Breast cancer Neg Hx    No Known Allergies   Patient Care Team: Melida Quitter, PA as PCP - General (Family Medicine)   Outpatient Medications Prior to Visit  Medication Sig   celecoxib (CELEBREX) 200 MG capsule Take 1 capsule (200 mg total) by mouth 2 (two) times daily.   EPINEPHrine 0.3 mg/0.3 mL IJ SOAJ injection Inject into the muscle. (Patient not taking: Reported on 01/09/2021)   fluconazole (DIFLUCAN) 150 MG tablet Take 1 tablet po once. May repeat dose in 3 days as needed for persistent symptoms.   pantoprazole (PROTONIX) 40 MG tablet Take 1 tablet (40 mg total) by  mouth daily.   sulfamethoxazole-trimethoprim (BACTRIM DS) 800-160 MG tablet Take 1 tablet by mouth 2 (two) times daily.   No facility-administered medications prior to visit.    ROS    Objective:    There were no vitals taken for this visit.   Physical Exam   No results found for any visits on 10/31/22.    Assessment & Plan:    Routine Health Maintenance and Physical Exam  Immunization History  Administered Date(s) Administered   DTaP 07/03/2017   Influenza,inj,Quad PF,6+ Mos 10/28/2021    Health Maintenance  Topic Date Due   HIV Screening  Never done   Hepatitis C Screening  Never done   INFLUENZA VACCINE  08/07/2022   COVID-19 Vaccine (1 - 2023-24 season) Never done   Cervical Cancer Screening (HPV/Pap Cotest)  11/02/2025   DTaP/Tdap/Td (2 - Tdap) 07/04/2027   HPV VACCINES  Aged Out    Reviewed most recent labs including CBC, CMP, lipid  panel, A1C, TSH, and vitamin D. All within normal limits/stable from last check other than ***. Discussed health benefits of physical activity, and encouraged her to engage in regular exercise appropriate for her age and condition.  There are no diagnoses linked to this encounter.  No follow-ups on file.     Melida Quitter, PA

## 2022-11-13 ENCOUNTER — Telehealth: Payer: Self-pay | Admitting: Nurse Practitioner

## 2022-11-13 DIAGNOSIS — K219 Gastro-esophageal reflux disease without esophagitis: Secondary | ICD-10-CM

## 2022-11-13 NOTE — Telephone Encounter (Signed)
Sorry. This patient has you listed as PCP

## 2022-12-19 ENCOUNTER — Encounter: Payer: BC Managed Care – PPO | Admitting: Family Medicine

## 2023-02-12 ENCOUNTER — Encounter: Payer: Self-pay | Admitting: Family Medicine
